# Patient Record
Sex: Female | Born: 1975 | Hispanic: Yes | Marital: Single
Health system: Southern US, Community
[De-identification: ages and names within clinical notes are randomized; demographics above are authoritative.]

---

## 2012-12-26 ENCOUNTER — Emergency Department: Payer: Self-pay | Admitting: Emergency Medicine

## 2012-12-26 LAB — DRUG SCREEN, URINE
Amphetamines, Ur Screen: NEGATIVE (ref ?–1000)
Barbiturates, Ur Screen: NEGATIVE (ref ?–200)
Benzodiazepine, Ur Scrn: NEGATIVE (ref ?–200)
Cannabinoid 50 Ng, Ur ~~LOC~~: NEGATIVE (ref ?–50)
Methadone, Ur Screen: NEGATIVE (ref ?–300)
Phencyclidine (PCP) Ur S: NEGATIVE (ref ?–25)
Tricyclic, Ur Screen: NEGATIVE (ref ?–1000)

## 2012-12-26 LAB — TSH: Thyroid Stimulating Horm: 1.37 u[IU]/mL

## 2012-12-26 LAB — CBC
HCT: 39.7 % (ref 35.0–47.0)
HGB: 13.8 g/dL (ref 12.0–16.0)
MCH: 31.7 pg (ref 26.0–34.0)
MCV: 91 fL (ref 80–100)
WBC: 7.8 10*3/uL (ref 3.6–11.0)

## 2012-12-26 LAB — COMPREHENSIVE METABOLIC PANEL
Alkaline Phosphatase: 76 U/L (ref 50–136)
Anion Gap: 9 (ref 7–16)
Calcium, Total: 9.1 mg/dL (ref 8.5–10.1)
Chloride: 106 mmol/L (ref 98–107)
Creatinine: 0.57 mg/dL — ABNORMAL LOW (ref 0.60–1.30)
Glucose: 132 mg/dL — ABNORMAL HIGH (ref 65–99)
Osmolality: 275 (ref 275–301)
SGOT(AST): 12 U/L — ABNORMAL LOW (ref 15–37)
Total Protein: 8.4 g/dL — ABNORMAL HIGH (ref 6.4–8.2)

## 2012-12-26 LAB — URINALYSIS, COMPLETE
Blood: NEGATIVE
Nitrite: NEGATIVE
Protein: 30
RBC,UR: 5 /HPF (ref 0–5)
Specific Gravity: 1.034 (ref 1.003–1.030)

## 2012-12-26 LAB — ETHANOL: Ethanol: <3 mg/dL

## 2014-03-02 LAB — COMPREHENSIVE METABOLIC PANEL
ALBUMIN: 4.4 g/dL (ref 3.4–5.0)
ALK PHOS: 100 U/L
Anion Gap: 13 (ref 7–16)
BUN: 9 mg/dL (ref 7–18)
Bilirubin,Total: 0.7 mg/dL (ref 0.2–1.0)
CALCIUM: 9.7 mg/dL (ref 8.5–10.1)
CHLORIDE: 110 mmol/L — AB (ref 98–107)
Co2: 18 mmol/L — ABNORMAL LOW (ref 21–32)
Creatinine: 0.57 mg/dL — ABNORMAL LOW (ref 0.60–1.30)
Glucose: 116 mg/dL — ABNORMAL HIGH (ref 65–99)
Osmolality: 281 (ref 275–301)
Potassium: 3.6 mmol/L (ref 3.5–5.1)
SGOT(AST): 65 U/L — ABNORMAL HIGH (ref 15–37)
SGPT (ALT): 135 U/L — ABNORMAL HIGH
Sodium: 141 mmol/L (ref 136–145)
TOTAL PROTEIN: 9.3 g/dL — AB (ref 6.4–8.2)

## 2014-03-02 LAB — ETHANOL
Ethanol %: <0.003 % (ref 0.000–0.080)
Ethanol: <3 mg/dL

## 2014-03-02 LAB — CBC
HCT: 47.5 % — AB (ref 35.0–47.0)
HGB: 15.7 g/dL (ref 12.0–16.0)
MCH: 31.2 pg (ref 26.0–34.0)
MCHC: 33.2 g/dL (ref 32.0–36.0)
MCV: 94 fL (ref 80–100)
PLATELETS: 313 10*3/uL (ref 150–440)
RBC: 5.04 10*6/uL (ref 3.80–5.20)
RDW: 13 % (ref 11.5–14.5)
WBC: 11.4 10*3/uL — ABNORMAL HIGH (ref 3.6–11.0)

## 2014-03-03 ENCOUNTER — Inpatient Hospital Stay: Payer: Self-pay | Admitting: Psychiatry

## 2014-03-03 LAB — URINALYSIS, COMPLETE
Bilirubin,UR: NEGATIVE
Blood: NEGATIVE
Glucose,UR: >=500 mg/dL (ref 0–75)
LEUKOCYTE ESTERASE: NEGATIVE
Nitrite: NEGATIVE
Ph: 6 (ref 4.5–8.0)
Protein: 100
SPECIFIC GRAVITY: 1.031 (ref 1.003–1.030)
Squamous Epithelial: 9

## 2014-03-03 LAB — DRUG SCREEN, URINE

## 2014-03-05 LAB — HEPATIC FUNCTION PANEL A (ARMC)
ALT: 93 U/L — AB
AST: 38 U/L — AB (ref 15–37)
Albumin: 3.9 g/dL (ref 3.4–5.0)
Alkaline Phosphatase: 91 U/L
Bilirubin, Direct: 0.1 mg/dL (ref 0.00–0.20)
Bilirubin,Total: 0.6 mg/dL (ref 0.2–1.0)
Total Protein: 8.4 g/dL — ABNORMAL HIGH (ref 6.4–8.2)

## 2014-03-05 LAB — LIPID PANEL
Cholesterol: 210 mg/dL — ABNORMAL HIGH (ref 0–200)
HDL: 39 mg/dL — AB (ref 40–60)
LDL CHOLESTEROL, CALC: 149 mg/dL — AB (ref 0–100)
TRIGLYCERIDES: 110 mg/dL (ref 0–200)
VLDL CHOLESTEROL, CALC: 22 mg/dL (ref 5–40)

## 2014-03-05 LAB — TSH: Thyroid Stimulating Horm: 1.91 u[IU]/mL

## 2014-03-05 LAB — HEMOGLOBIN A1C: Hemoglobin A1C: 6.7 % — ABNORMAL HIGH (ref 4.2–6.3)

## 2014-03-07 LAB — BASIC METABOLIC PANEL
ANION GAP: 9 (ref 7–16)
BUN: 6 mg/dL — ABNORMAL LOW (ref 7–18)
CALCIUM: 9.4 mg/dL (ref 8.5–10.1)
CO2: 25 mmol/L (ref 21–32)
Chloride: 104 mmol/L (ref 98–107)
Creatinine: 0.51 mg/dL — ABNORMAL LOW (ref 0.60–1.30)
EGFR (African American): >60
Glucose: 121 mg/dL — ABNORMAL HIGH (ref 65–99)
OSMOLALITY: 275 (ref 275–301)
Potassium: 3.7 mmol/L (ref 3.5–5.1)
SODIUM: 138 mmol/L (ref 136–145)

## 2014-03-19 ENCOUNTER — Emergency Department: Payer: Self-pay | Admitting: Emergency Medicine

## 2014-03-27 ENCOUNTER — Emergency Department: Payer: Self-pay | Admitting: Student

## 2014-04-16 ENCOUNTER — Ambulatory Visit: Payer: Self-pay | Admitting: Internal Medicine

## 2014-04-28 LAB — CBC WITH DIFFERENTIAL/PLATELET
BASOS ABS: 0.1 10*3/uL (ref 0.0–0.1)
Basophil %: 0.2 %
EOS PCT: 0 %
Eosinophil #: 0 10*3/uL (ref 0.0–0.7)
HCT: 48.3 % — ABNORMAL HIGH (ref 35.0–47.0)
HGB: 15.5 g/dL (ref 12.0–16.0)
LYMPHS PCT: 3.2 %
Lymphocyte #: 1 10*3/uL (ref 1.0–3.6)
MCH: 30.1 pg (ref 26.0–34.0)
MCHC: 32 g/dL (ref 32.0–36.0)
MCV: 94 fL (ref 80–100)
MONO ABS: 1.3 x10 3/mm — AB (ref 0.2–0.9)
MONOS PCT: 4 %
NEUTROS ABS: 29.9 10*3/uL — AB (ref 1.4–6.5)
NEUTROS PCT: 92.6 %
PLATELETS: 442 10*3/uL — AB (ref 150–440)
RBC: 5.14 10*6/uL (ref 3.80–5.20)
RDW: 13.9 % (ref 11.5–14.5)
WBC: 32.3 10*3/uL — ABNORMAL HIGH (ref 3.6–11.0)

## 2014-04-28 LAB — COMPREHENSIVE METABOLIC PANEL
ALBUMIN: 4.4 g/dL (ref 3.4–5.0)
ANION GAP: 21 — AB (ref 7–16)
Alkaline Phosphatase: 143 U/L — ABNORMAL HIGH
BILIRUBIN TOTAL: 0.7 mg/dL (ref 0.2–1.0)
BUN: 8 mg/dL (ref 7–18)
CO2: 16 mmol/L — AB (ref 21–32)
Calcium, Total: 9.1 mg/dL (ref 8.5–10.1)
Chloride: 104 mmol/L (ref 98–107)
Creatinine: 0.74 mg/dL (ref 0.60–1.30)
EGFR (African American): >60
EGFR (Non-African Amer.): >60
GLUCOSE: 257 mg/dL — AB (ref 65–99)
OSMOLALITY: 288 (ref 275–301)
POTASSIUM: 3 mmol/L — AB (ref 3.5–5.1)
SGOT(AST): 126 U/L — ABNORMAL HIGH (ref 15–37)
SGPT (ALT): 153 U/L — ABNORMAL HIGH
Sodium: 141 mmol/L (ref 136–145)
Total Protein: 9.4 g/dL — ABNORMAL HIGH (ref 6.4–8.2)

## 2014-04-28 LAB — ETHANOL

## 2014-04-28 LAB — WET PREP, GENITAL

## 2014-04-28 LAB — HCG, QUANTITATIVE, PREGNANCY: Beta Hcg, Quant.: <1 m[IU]/mL — ABNORMAL LOW

## 2014-04-28 LAB — ACETAMINOPHEN LEVEL: Acetaminophen: <2 ug/mL

## 2014-04-28 LAB — TROPONIN I: Troponin-I: <0.02 ng/mL

## 2014-04-28 LAB — SALICYLATE LEVEL

## 2014-04-29 ENCOUNTER — Inpatient Hospital Stay: Payer: Self-pay | Admitting: Internal Medicine

## 2014-04-29 ENCOUNTER — Ambulatory Visit: Payer: Self-pay | Admitting: Neurology

## 2014-04-29 LAB — URINALYSIS, COMPLETE
Bilirubin,UR: NEGATIVE
Blood: NEGATIVE
LEUKOCYTE ESTERASE: NEGATIVE
Nitrite: NEGATIVE
PH: 5 (ref 4.5–8.0)
Protein: 30
SQUAMOUS EPITHELIAL: NONE SEEN
Specific Gravity: 1.035 (ref 1.003–1.030)
WBC UR: 2 /HPF (ref 0–5)

## 2014-04-29 LAB — PROTIME-INR
INR: 1.2
PROTHROMBIN TIME: 15 s — AB (ref 11.5–14.7)

## 2014-04-29 LAB — BASIC METABOLIC PANEL
ANION GAP: 10 (ref 7–16)
BUN: 6 mg/dL — AB (ref 7–18)
CREATININE: 0.74 mg/dL (ref 0.60–1.30)
Calcium, Total: 8.1 mg/dL — ABNORMAL LOW (ref 8.5–10.1)
Chloride: 114 mmol/L — ABNORMAL HIGH (ref 98–107)
Co2: 20 mmol/L — ABNORMAL LOW (ref 21–32)
EGFR (African American): >60
Glucose: 257 mg/dL — ABNORMAL HIGH (ref 65–99)
Osmolality: 293 (ref 275–301)
Potassium: 3 mmol/L — ABNORMAL LOW (ref 3.5–5.1)
Sodium: 144 mmol/L (ref 136–145)

## 2014-04-29 LAB — DRUG SCREEN, URINE
Amphetamines, Ur Screen: NEGATIVE (ref ?–1000)
Barbiturates, Ur Screen: NEGATIVE (ref ?–200)
Benzodiazepine, Ur Scrn: NEGATIVE (ref ?–200)
Cannabinoid 50 Ng, Ur ~~LOC~~: NEGATIVE (ref ?–50)
Cocaine Metabolite,Ur ~~LOC~~: NEGATIVE (ref ?–300)
MDMA (ECSTASY) UR SCREEN: NEGATIVE (ref ?–500)
Methadone, Ur Screen: NEGATIVE (ref ?–300)
Opiate, Ur Screen: NEGATIVE (ref ?–300)
Phencyclidine (PCP) Ur S: NEGATIVE (ref ?–25)
TRICYCLIC, UR SCREEN: NEGATIVE (ref ?–1000)

## 2014-04-29 LAB — PHOSPHORUS: PHOSPHORUS: 1.6 mg/dL — AB (ref 2.5–4.9)

## 2014-04-29 LAB — APTT: Activated PTT: 27.8 secs (ref 23.6–35.9)

## 2014-04-29 LAB — TROPONIN I
TROPONIN-I: 0.03 ng/mL
Troponin-I: <0.02 ng/mL

## 2014-04-29 LAB — GC/CHLAMYDIA PROBE AMP

## 2014-04-29 LAB — CK: CK, Total: 71 U/L

## 2014-04-29 LAB — POTASSIUM: Potassium: 3.3 mmol/L — ABNORMAL LOW (ref 3.5–5.1)

## 2014-04-29 LAB — MAGNESIUM: MAGNESIUM: 2.1 mg/dL

## 2014-04-30 LAB — COMPREHENSIVE METABOLIC PANEL
ALK PHOS: 102 U/L
ANION GAP: 9 (ref 7–16)
Albumin: 2.8 g/dL — ABNORMAL LOW (ref 3.4–5.0)
BUN: 7 mg/dL (ref 7–18)
Bilirubin,Total: 0.7 mg/dL (ref 0.2–1.0)
CHLORIDE: 122 mmol/L — AB (ref 98–107)
CO2: 18 mmol/L — AB (ref 21–32)
CREATININE: 0.59 mg/dL — AB (ref 0.60–1.30)
Calcium, Total: 8.3 mg/dL — ABNORMAL LOW (ref 8.5–10.1)
EGFR (African American): >60
EGFR (Non-African Amer.): >60
Glucose: 157 mg/dL — ABNORMAL HIGH (ref 65–99)
OSMOLALITY: 297 (ref 275–301)
Potassium: 4 mmol/L (ref 3.5–5.1)
SGOT(AST): 24 U/L (ref 15–37)
SGPT (ALT): 56 U/L
Sodium: 149 mmol/L — ABNORMAL HIGH (ref 136–145)
Total Protein: 6.9 g/dL (ref 6.4–8.2)

## 2014-04-30 LAB — MAGNESIUM: Magnesium: 2.1 mg/dL

## 2014-04-30 LAB — CBC WITH DIFFERENTIAL/PLATELET
BASOS PCT: 0.5 %
Basophil #: 0.1 10*3/uL (ref 0.0–0.1)
Eosinophil #: 0 10*3/uL (ref 0.0–0.7)
Eosinophil %: 0 %
HCT: 47.1 % — ABNORMAL HIGH (ref 35.0–47.0)
HGB: 14.8 g/dL (ref 12.0–16.0)
LYMPHS ABS: 3.5 10*3/uL (ref 1.0–3.6)
LYMPHS PCT: 15.5 %
MCH: 29.8 pg (ref 26.0–34.0)
MCHC: 31.5 g/dL — ABNORMAL LOW (ref 32.0–36.0)
MCV: 95 fL (ref 80–100)
Monocyte #: 1.4 x10 3/mm — ABNORMAL HIGH (ref 0.2–0.9)
Monocyte %: 6.1 %
NEUTROS ABS: 17.4 10*3/uL — AB (ref 1.4–6.5)
NEUTROS PCT: 77.9 %
PLATELETS: 311 10*3/uL (ref 150–440)
RBC: 4.96 10*6/uL (ref 3.80–5.20)
RDW: 14.2 % (ref 11.5–14.5)
WBC: 22.4 10*3/uL — AB (ref 3.6–11.0)

## 2014-04-30 LAB — TSH: Thyroid Stimulating Horm: 0.253 u[IU]/mL — ABNORMAL LOW

## 2014-04-30 LAB — URINE CULTURE

## 2014-04-30 LAB — HEMOGLOBIN A1C: Hemoglobin A1C: 6.7 % — ABNORMAL HIGH (ref 4.2–6.3)

## 2014-05-01 DIAGNOSIS — G931 Anoxic brain damage, not elsewhere classified: Secondary | ICD-10-CM

## 2014-05-01 DIAGNOSIS — I442 Atrioventricular block, complete: Secondary | ICD-10-CM

## 2014-05-01 LAB — CBC WITH DIFFERENTIAL/PLATELET
BASOS PCT: 0.4 %
Basophil #: 0.1 10*3/uL (ref 0.0–0.1)
Basophil #: 0.1 10*3/uL (ref 0.0–0.1)
Basophil %: 0.5 %
EOS ABS: 0 10*3/uL (ref 0.0–0.7)
EOS PCT: 0.1 %
Eosinophil #: 0 10*3/uL (ref 0.0–0.7)
Eosinophil %: 0.1 %
HCT: 40.3 % (ref 35.0–47.0)
HCT: 38.6 % (ref 35.0–47.0)
HGB: 12.8 g/dL (ref 12.0–16.0)
HGB: 12.3 g/dL (ref 12.0–16.0)
LYMPHS ABS: 2.6 10*3/uL (ref 1.0–3.6)
LYMPHS PCT: 23 %
Lymphocyte #: 3.2 10*3/uL (ref 1.0–3.6)
Lymphocyte %: 22.5 %
MCH: 30 pg (ref 26.0–34.0)
MCH: 30.1 pg (ref 26.0–34.0)
MCHC: 31.8 g/dL — AB (ref 32.0–36.0)
MCHC: 31.9 g/dL — ABNORMAL LOW (ref 32.0–36.0)
MCV: 95 fL (ref 80–100)
MCV: 95 fL (ref 80–100)
Monocyte #: 0.9 x10 3/mm (ref 0.2–0.9)
Monocyte #: 0.8 x10 3/mm (ref 0.2–0.9)
Monocyte %: 6.8 %
Monocyte %: 6.8 %
NEUTROS ABS: 9.7 10*3/uL — AB (ref 1.4–6.5)
Neutrophil #: 8 10*3/uL — ABNORMAL HIGH (ref 1.4–6.5)
Neutrophil %: 69.7 %
Neutrophil %: 70.1 %
Platelet: 256 10*3/uL (ref 150–440)
Platelet: 232 10*3/uL (ref 150–440)
RBC: 4.27 10*6/uL (ref 3.80–5.20)
RBC: 4.09 10*6/uL (ref 3.80–5.20)
RDW: 14.3 % (ref 11.5–14.5)
RDW: 14.3 % (ref 11.5–14.5)
WBC: 14 10*3/uL — AB (ref 3.6–11.0)
WBC: 11.5 10*3/uL — ABNORMAL HIGH (ref 3.6–11.0)

## 2014-05-01 LAB — COMPREHENSIVE METABOLIC PANEL
ALBUMIN: 2.5 g/dL — AB (ref 3.4–5.0)
ALK PHOS: 89 U/L
ALT: 38 U/L
ANION GAP: 9 (ref 7–16)
AST: 44 U/L — AB (ref 15–37)
Albumin: 2.5 g/dL — ABNORMAL LOW (ref 3.4–5.0)
Alkaline Phosphatase: 97 U/L
Anion Gap: 15 (ref 7–16)
BUN: 6 mg/dL — ABNORMAL LOW (ref 7–18)
BUN: 5 mg/dL — ABNORMAL LOW (ref 7–18)
Bilirubin,Total: 0.5 mg/dL (ref 0.2–1.0)
Bilirubin,Total: 0.5 mg/dL (ref 0.2–1.0)
CALCIUM: 8 mg/dL — AB (ref 8.5–10.1)
CALCIUM: 7.8 mg/dL — AB (ref 8.5–10.1)
CHLORIDE: 124 mmol/L — AB (ref 98–107)
CHLORIDE: 118 mmol/L — AB (ref 98–107)
CO2: 19 mmol/L — AB (ref 21–32)
Co2: 20 mmol/L — ABNORMAL LOW (ref 21–32)
Creatinine: 0.46 mg/dL — ABNORMAL LOW (ref 0.60–1.30)
Creatinine: 0.38 mg/dL — ABNORMAL LOW (ref 0.60–1.30)
EGFR (African American): >60
EGFR (Non-African Amer.): >60
EGFR (Non-African Amer.): >60
Glucose: 134 mg/dL — ABNORMAL HIGH (ref 65–99)
Glucose: 136 mg/dL — ABNORMAL HIGH (ref 65–99)
Osmolality: 301 (ref 275–301)
Potassium: 3.9 mmol/L (ref 3.5–5.1)
Potassium: 4.2 mmol/L (ref 3.5–5.1)
SGOT(AST): 14 U/L — ABNORMAL LOW (ref 15–37)
SGPT (ALT): 43 U/L
Sodium: 152 mmol/L — ABNORMAL HIGH (ref 136–145)
Sodium: 153 mmol/L — ABNORMAL HIGH (ref 136–145)
Total Protein: 6.3 g/dL — ABNORMAL LOW (ref 6.4–8.2)
Total Protein: 6.2 g/dL — ABNORMAL LOW (ref 6.4–8.2)

## 2014-05-01 LAB — PHOSPHORUS
PHOSPHORUS: 1.2 mg/dL — AB (ref 2.5–4.9)
Phosphorus: 3.6 mg/dL (ref 2.5–4.9)

## 2014-05-01 LAB — MAGNESIUM
MAGNESIUM: 1.8 mg/dL
Magnesium: 1.7 mg/dL — ABNORMAL LOW

## 2014-05-02 LAB — BASIC METABOLIC PANEL
Anion Gap: 8 (ref 7–16)
BUN: 6 mg/dL — ABNORMAL LOW (ref 7–18)
CALCIUM: 7.1 mg/dL — AB (ref 8.5–10.1)
Chloride: 127 mmol/L — ABNORMAL HIGH (ref 98–107)
Co2: 17 mmol/L — ABNORMAL LOW (ref 21–32)
Creatinine: 0.42 mg/dL — ABNORMAL LOW (ref 0.60–1.30)
Glucose: 105 mg/dL — ABNORMAL HIGH (ref 65–99)
Osmolality: 300 (ref 275–301)
Potassium: 7.5 mmol/L (ref 3.5–5.1)
Sodium: 152 mmol/L — ABNORMAL HIGH (ref 136–145)

## 2014-05-02 LAB — CBC WITH DIFFERENTIAL/PLATELET
Basophil #: 0.1 10*3/uL (ref 0.0–0.1)
Basophil %: 0.8 %
Eosinophil #: 0 10*3/uL (ref 0.0–0.7)
Eosinophil %: 0.6 %
HCT: 35.2 % (ref 35.0–47.0)
HGB: 11 g/dL — ABNORMAL LOW (ref 12.0–16.0)
Lymphocyte #: 1.7 10*3/uL (ref 1.0–3.6)
Lymphocyte %: 22.1 %
MCH: 29.8 pg (ref 26.0–34.0)
MCHC: 31.3 g/dL — ABNORMAL LOW (ref 32.0–36.0)
MCV: 95 fL (ref 80–100)
Monocyte #: 0.5 x10 3/mm (ref 0.2–0.9)
Monocyte %: 6.1 %
Neutrophil #: 5.5 10*3/uL (ref 1.4–6.5)
Neutrophil %: 70.4 %
Platelet: 189 10*3/uL (ref 150–440)
RBC: 3.69 10*6/uL — ABNORMAL LOW (ref 3.80–5.20)
RDW: 14.2 % (ref 11.5–14.5)
WBC: 7.8 10*3/uL (ref 3.6–11.0)

## 2014-05-02 LAB — POTASSIUM
Potassium: 3.3 mmol/L — ABNORMAL LOW (ref 3.5–5.1)
Potassium: 3.1 mmol/L — ABNORMAL LOW (ref 3.5–5.1)

## 2014-05-03 LAB — CBC WITH DIFFERENTIAL/PLATELET
Basophil #: 0 10*3/uL (ref 0.0–0.1)
Basophil %: 0.4 %
EOS PCT: 1.3 %
Eosinophil #: 0.1 10*3/uL (ref 0.0–0.7)
HCT: 35.3 % (ref 35.0–47.0)
HGB: 11.2 g/dL — ABNORMAL LOW (ref 12.0–16.0)
LYMPHS ABS: 1.8 10*3/uL (ref 1.0–3.6)
Lymphocyte %: 22.5 %
MCH: 29.9 pg (ref 26.0–34.0)
MCHC: 31.8 g/dL — ABNORMAL LOW (ref 32.0–36.0)
MCV: 94 fL (ref 80–100)
Monocyte #: 0.4 x10 3/mm (ref 0.2–0.9)
Monocyte %: 5.4 %
Neutrophil #: 5.5 10*3/uL (ref 1.4–6.5)
Neutrophil %: 70.4 %
Platelet: 176 10*3/uL (ref 150–440)
RBC: 3.75 10*6/uL — ABNORMAL LOW (ref 3.80–5.20)
RDW: 14 % (ref 11.5–14.5)
WBC: 7.9 10*3/uL (ref 3.6–11.0)

## 2014-05-03 LAB — BASIC METABOLIC PANEL
Anion Gap: 9 (ref 7–16)
BUN: 7 mg/dL (ref 7–18)
CO2: 28 mmol/L (ref 21–32)
Calcium, Total: 7.8 mg/dL — ABNORMAL LOW (ref 8.5–10.1)
Chloride: 112 mmol/L — ABNORMAL HIGH (ref 98–107)
Creatinine: 0.46 mg/dL — ABNORMAL LOW (ref 0.60–1.30)
EGFR (Non-African Amer.): >60
GLUCOSE: 160 mg/dL — AB (ref 65–99)
Osmolality: 298 (ref 275–301)
POTASSIUM: 2.8 mmol/L — AB (ref 3.5–5.1)
SODIUM: 149 mmol/L — AB (ref 136–145)

## 2014-05-03 LAB — CULTURE, BLOOD (SINGLE)

## 2014-05-03 LAB — SEDIMENTATION RATE: Erythrocyte Sed Rate: 68 mm/hr — ABNORMAL HIGH (ref 0–20)

## 2014-05-03 LAB — URINE CULTURE

## 2014-05-03 LAB — AMMONIA: Ammonia, Plasma: 71 mcmol/L — ABNORMAL HIGH (ref 11–32)

## 2014-05-03 LAB — POTASSIUM: Potassium: 3.1 mmol/L — ABNORMAL LOW (ref 3.5–5.1)

## 2014-05-03 LAB — CK: CK, Total: 3542 U/L — ABNORMAL HIGH

## 2014-05-04 LAB — WOUND AEROBIC CULTURE

## 2014-05-04 LAB — VANCOMYCIN, TROUGH: VANCOMYCIN, TROUGH: 3 ug/mL — AB (ref 10–20)

## 2014-05-04 LAB — POTASSIUM: POTASSIUM: 3.1 mmol/L — AB (ref 3.5–5.1)

## 2014-05-05 LAB — BASIC METABOLIC PANEL
Anion Gap: 8 (ref 7–16)
BUN: 7 mg/dL (ref 7–18)
CALCIUM: 8.1 mg/dL — AB (ref 8.5–10.1)
CO2: 27 mmol/L (ref 21–32)
Chloride: 111 mmol/L — ABNORMAL HIGH (ref 98–107)
Creatinine: 0.41 mg/dL — ABNORMAL LOW (ref 0.60–1.30)
EGFR (African American): >60
Glucose: 213 mg/dL — ABNORMAL HIGH (ref 65–99)
Osmolality: 295 (ref 275–301)
Potassium: 3.2 mmol/L — ABNORMAL LOW (ref 3.5–5.1)
Sodium: 146 mmol/L — ABNORMAL HIGH (ref 136–145)

## 2014-05-05 LAB — CBC WITH DIFFERENTIAL/PLATELET
BASOS ABS: 0 10*3/uL (ref 0.0–0.1)
Basophil %: 0.4 %
Eosinophil #: 0.2 10*3/uL (ref 0.0–0.7)
Eosinophil %: 2.7 %
HCT: 34.5 % — AB (ref 35.0–47.0)
HGB: 11 g/dL — ABNORMAL LOW (ref 12.0–16.0)
Lymphocyte #: 1.5 10*3/uL (ref 1.0–3.6)
Lymphocyte %: 23.1 %
MCH: 30.5 pg (ref 26.0–34.0)
MCHC: 32 g/dL (ref 32.0–36.0)
MCV: 95 fL (ref 80–100)
MONO ABS: 0.4 x10 3/mm (ref 0.2–0.9)
Monocyte %: 6.4 %
Neutrophil #: 4.5 10*3/uL (ref 1.4–6.5)
Neutrophil %: 67.4 %
Platelet: 171 10*3/uL (ref 150–440)
RBC: 3.61 10*6/uL — ABNORMAL LOW (ref 3.80–5.20)
RDW: 13.7 % (ref 11.5–14.5)
WBC: 6.7 10*3/uL (ref 3.6–11.0)

## 2014-05-05 LAB — AMMONIA: AMMONIA, PLASMA: 44 umol/L — AB (ref 11–32)

## 2014-05-05 LAB — CLOSTRIDIUM DIFFICILE(ARMC)

## 2014-05-05 LAB — CK: CK, Total: 1188 U/L — ABNORMAL HIGH

## 2014-05-05 LAB — MAGNESIUM: MAGNESIUM: 1.9 mg/dL

## 2014-05-05 LAB — RAPID HIV SCREEN (HIV 1/2 AB+AG)

## 2014-05-05 LAB — POTASSIUM: Potassium: 3.6 mmol/L (ref 3.5–5.1)

## 2014-05-05 LAB — PHOSPHORUS: Phosphorus: 2.6 mg/dL (ref 2.5–4.9)

## 2014-05-05 LAB — MISC AER/ANAEROBIC CULT.

## 2014-05-06 LAB — CBC WITH DIFFERENTIAL/PLATELET
Basophil #: 0 10*3/uL (ref 0.0–0.1)
Basophil %: 0.4 %
EOS ABS: 0.2 10*3/uL (ref 0.0–0.7)
Eosinophil %: 1.7 %
HCT: 37.3 % (ref 35.0–47.0)
HGB: 12.4 g/dL (ref 12.0–16.0)
LYMPHS ABS: 1.9 10*3/uL (ref 1.0–3.6)
LYMPHS PCT: 20.5 %
MCH: 31.3 pg (ref 26.0–34.0)
MCHC: 33.2 g/dL (ref 32.0–36.0)
MCV: 94 fL (ref 80–100)
MONO ABS: 0.7 x10 3/mm (ref 0.2–0.9)
MONOS PCT: 8.2 %
NEUTROS ABS: 6.3 10*3/uL (ref 1.4–6.5)
Neutrophil %: 69.2 %
Platelet: 231 10*3/uL (ref 150–440)
RBC: 3.96 10*6/uL (ref 3.80–5.20)
RDW: 14.1 % (ref 11.5–14.5)
WBC: 9.1 10*3/uL (ref 3.6–11.0)

## 2014-05-06 LAB — POTASSIUM: Potassium: 3.8 mmol/L (ref 3.5–5.1)

## 2014-05-06 LAB — BASIC METABOLIC PANEL
ANION GAP: 7 (ref 7–16)
BUN: 13 mg/dL (ref 7–18)
CALCIUM: 8.6 mg/dL (ref 8.5–10.1)
CHLORIDE: 108 mmol/L — AB (ref 98–107)
Co2: 30 mmol/L (ref 21–32)
Creatinine: 0.51 mg/dL — ABNORMAL LOW (ref 0.60–1.30)
EGFR (African American): >60
EGFR (Non-African Amer.): >60
Glucose: 150 mg/dL — ABNORMAL HIGH (ref 65–99)
OSMOLALITY: 292 (ref 275–301)
Potassium: 3.4 mmol/L — ABNORMAL LOW (ref 3.5–5.1)
Sodium: 145 mmol/L (ref 136–145)

## 2014-05-06 LAB — AMMONIA: Ammonia, Plasma: 23 mcmol/L (ref 11–32)

## 2014-05-06 LAB — MAGNESIUM: Magnesium: 2.2 mg/dL

## 2014-05-07 LAB — CULTURE, BLOOD (SINGLE)

## 2014-05-07 LAB — CBC WITH DIFFERENTIAL/PLATELET
Basophil #: 0 10*3/uL (ref 0.0–0.1)
Basophil %: 0.5 %
Eosinophil #: 0.1 10*3/uL (ref 0.0–0.7)
Eosinophil %: 1.5 %
HCT: 37.6 % (ref 35.0–47.0)
HGB: 12 g/dL (ref 12.0–16.0)
LYMPHS ABS: 2.4 10*3/uL (ref 1.0–3.6)
LYMPHS PCT: 25.3 %
MCH: 30.7 pg (ref 26.0–34.0)
MCHC: 32 g/dL (ref 32.0–36.0)
MCV: 96 fL (ref 80–100)
Monocyte #: 0.8 x10 3/mm (ref 0.2–0.9)
Monocyte %: 8.9 %
Neutrophil #: 6.1 10*3/uL (ref 1.4–6.5)
Neutrophil %: 63.8 %
Platelet: 261 10*3/uL (ref 150–440)
RBC: 3.92 10*6/uL (ref 3.80–5.20)
RDW: 14.5 % (ref 11.5–14.5)
WBC: 9.5 10*3/uL (ref 3.6–11.0)

## 2014-05-07 LAB — AMMONIA: Ammonia, Plasma: 15 mcmol/L (ref 11–32)

## 2014-05-07 LAB — BASIC METABOLIC PANEL
ANION GAP: 10 (ref 7–16)
BUN: 15 mg/dL (ref 7–18)
Calcium, Total: 8.3 mg/dL — ABNORMAL LOW (ref 8.5–10.1)
Chloride: 107 mmol/L (ref 98–107)
Co2: 28 mmol/L (ref 21–32)
Creatinine: 0.55 mg/dL — ABNORMAL LOW (ref 0.60–1.30)
EGFR (African American): >60
EGFR (Non-African Amer.): >60
Glucose: 203 mg/dL — ABNORMAL HIGH (ref 65–99)
Osmolality: 295 (ref 275–301)
Potassium: 3.1 mmol/L — ABNORMAL LOW (ref 3.5–5.1)
Sodium: 145 mmol/L (ref 136–145)

## 2014-05-07 LAB — POTASSIUM: POTASSIUM: 4 mmol/L (ref 3.5–5.1)

## 2014-05-08 LAB — CBC WITH DIFFERENTIAL/PLATELET
Basophil #: 0.1 10*3/uL (ref 0.0–0.1)
Basophil %: 0.8 %
Eosinophil #: 0.2 10*3/uL (ref 0.0–0.7)
Eosinophil %: 2.2 %
HCT: 35.7 % (ref 35.0–47.0)
HGB: 11.5 g/dL — AB (ref 12.0–16.0)
LYMPHS ABS: 2.4 10*3/uL (ref 1.0–3.6)
LYMPHS PCT: 30.1 %
MCH: 30.5 pg (ref 26.0–34.0)
MCHC: 32.2 g/dL (ref 32.0–36.0)
MCV: 95 fL (ref 80–100)
MONO ABS: 0.8 x10 3/mm (ref 0.2–0.9)
Monocyte %: 10.1 %
NEUTROS ABS: 4.5 10*3/uL (ref 1.4–6.5)
Neutrophil %: 56.8 %
PLATELETS: 264 10*3/uL (ref 150–440)
RBC: 3.76 10*6/uL — AB (ref 3.80–5.20)
RDW: 14.3 % (ref 11.5–14.5)
WBC: 7.8 10*3/uL (ref 3.6–11.0)

## 2014-05-08 LAB — BASIC METABOLIC PANEL
ANION GAP: 8 (ref 7–16)
BUN: 14 mg/dL (ref 7–18)
CHLORIDE: 98 mmol/L (ref 98–107)
CO2: 29 mmol/L (ref 21–32)
CREATININE: 0.45 mg/dL — AB (ref 0.60–1.30)
Calcium, Total: 8.1 mg/dL — ABNORMAL LOW (ref 8.5–10.1)
EGFR (Non-African Amer.): >60
Glucose: 253 mg/dL — ABNORMAL HIGH (ref 65–99)
Osmolality: 279 (ref 275–301)
Potassium: 3 mmol/L — ABNORMAL LOW (ref 3.5–5.1)
Sodium: 135 mmol/L — ABNORMAL LOW (ref 136–145)

## 2014-05-08 LAB — POTASSIUM: Potassium: 2.8 mmol/L — ABNORMAL LOW (ref 3.5–5.1)

## 2014-05-08 LAB — URINE CULTURE

## 2014-05-08 LAB — MAGNESIUM: MAGNESIUM: 2 mg/dL

## 2014-05-08 LAB — VANCOMYCIN, TROUGH: VANCOMYCIN, TROUGH: 7 ug/mL — AB (ref 10–20)

## 2014-05-09 LAB — BASIC METABOLIC PANEL
ANION GAP: 11 (ref 7–16)
BUN: 10 mg/dL (ref 7–18)
Calcium, Total: 7.8 mg/dL — ABNORMAL LOW (ref 8.5–10.1)
Chloride: 98 mmol/L (ref 98–107)
Co2: 31 mmol/L (ref 21–32)
Creatinine: 0.45 mg/dL — ABNORMAL LOW (ref 0.60–1.30)
EGFR (African American): >60
Glucose: 183 mg/dL — ABNORMAL HIGH (ref 65–99)
OSMOLALITY: 283 (ref 275–301)
POTASSIUM: 2.9 mmol/L — AB (ref 3.5–5.1)
Sodium: 140 mmol/L (ref 136–145)

## 2014-05-09 LAB — VANCOMYCIN, TROUGH: Vancomycin, Trough: 9 ug/mL — ABNORMAL LOW (ref 10–20)

## 2014-05-09 LAB — CBC WITH DIFFERENTIAL/PLATELET
Basophil #: 0 10*3/uL (ref 0.0–0.1)
Basophil %: 0.2 %
Eosinophil #: 0.2 10*3/uL (ref 0.0–0.7)
Eosinophil %: 2.4 %
HCT: 35.5 % (ref 35.0–47.0)
HGB: 11.8 g/dL — AB (ref 12.0–16.0)
Lymphocyte #: 2.1 10*3/uL (ref 1.0–3.6)
Lymphocyte %: 23.1 %
MCH: 31.2 pg (ref 26.0–34.0)
MCHC: 33.2 g/dL (ref 32.0–36.0)
MCV: 94 fL (ref 80–100)
MONO ABS: 0.7 x10 3/mm (ref 0.2–0.9)
Monocyte %: 7.3 %
NEUTROS ABS: 6.2 10*3/uL (ref 1.4–6.5)
Neutrophil %: 67 %
Platelet: 276 10*3/uL (ref 150–440)
RBC: 3.79 10*6/uL — AB (ref 3.80–5.20)
RDW: 14.2 % (ref 11.5–14.5)
WBC: 9.3 10*3/uL (ref 3.6–11.0)

## 2014-05-09 LAB — STOOL CULTURE

## 2014-05-09 LAB — POTASSIUM: Potassium: 3.7 mmol/L (ref 3.5–5.1)

## 2014-05-10 LAB — VANCOMYCIN, TROUGH: VANCOMYCIN, TROUGH: 17 ug/mL (ref 10–20)

## 2014-05-11 LAB — CBC WITH DIFFERENTIAL/PLATELET
BASOS ABS: 0 10*3/uL (ref 0.0–0.1)
Basophil %: 0.4 %
EOS ABS: 0.2 10*3/uL (ref 0.0–0.7)
Eosinophil %: 2.1 %
HCT: 33.8 % — AB (ref 35.0–47.0)
HGB: 11.3 g/dL — AB (ref 12.0–16.0)
Lymphocyte #: 2 10*3/uL (ref 1.0–3.6)
Lymphocyte %: 20.3 %
MCH: 32 pg (ref 26.0–34.0)
MCHC: 33.4 g/dL (ref 32.0–36.0)
MCV: 96 fL (ref 80–100)
MONOS PCT: 8.9 %
Monocyte #: 0.9 x10 3/mm (ref 0.2–0.9)
Neutrophil #: 6.8 10*3/uL — ABNORMAL HIGH (ref 1.4–6.5)
Neutrophil %: 68.3 %
Platelet: 294 10*3/uL (ref 150–440)
RBC: 3.53 10*6/uL — ABNORMAL LOW (ref 3.80–5.20)
RDW: 14.7 % — ABNORMAL HIGH (ref 11.5–14.5)
WBC: 9.9 10*3/uL (ref 3.6–11.0)

## 2014-05-11 LAB — COMPREHENSIVE METABOLIC PANEL
ANION GAP: 8 (ref 7–16)
AST: 29 U/L (ref 15–37)
Albumin: 2.7 g/dL — ABNORMAL LOW (ref 3.4–5.0)
Alkaline Phosphatase: 103 U/L
BUN: 10 mg/dL (ref 7–18)
Bilirubin,Total: 0.4 mg/dL (ref 0.2–1.0)
CALCIUM: 8.4 mg/dL — AB (ref 8.5–10.1)
CO2: 31 mmol/L (ref 21–32)
Chloride: 106 mmol/L (ref 98–107)
Creatinine: 0.66 mg/dL (ref 0.60–1.30)
EGFR (African American): >60
EGFR (Non-African Amer.): >60
Glucose: 151 mg/dL — ABNORMAL HIGH (ref 65–99)
Osmolality: 291 (ref 275–301)
POTASSIUM: 2.7 mmol/L — AB (ref 3.5–5.1)
SGPT (ALT): 33 U/L
Sodium: 145 mmol/L (ref 136–145)
TOTAL PROTEIN: 7 g/dL (ref 6.4–8.2)

## 2014-05-11 LAB — SEDIMENTATION RATE: Erythrocyte Sed Rate: 76 mm/hr — ABNORMAL HIGH (ref 0–20)

## 2014-05-11 LAB — AMMONIA: Ammonia, Plasma: 25 mcmol/L (ref 11–32)

## 2014-05-11 LAB — PHOSPHORUS: Phosphorus: 3.9 mg/dL (ref 2.5–4.9)

## 2014-05-11 LAB — MAGNESIUM: MAGNESIUM: 2.3 mg/dL

## 2014-05-11 LAB — POTASSIUM
Potassium: 3.3 mmol/L — ABNORMAL LOW (ref 3.5–5.1)
Potassium: 3.3 mmol/L — ABNORMAL LOW (ref 3.5–5.1)

## 2014-05-11 LAB — CULTURE, BLOOD (SINGLE)

## 2014-05-12 LAB — CBC WITH DIFFERENTIAL/PLATELET
BASOS PCT: 0.5 %
Basophil #: 0 10*3/uL (ref 0.0–0.1)
EOS ABS: 0.1 10*3/uL (ref 0.0–0.7)
Eosinophil %: 1.5 %
HCT: 35.3 % (ref 35.0–47.0)
HGB: 11.6 g/dL — AB (ref 12.0–16.0)
LYMPHS PCT: 22.5 %
Lymphocyte #: 1.8 10*3/uL (ref 1.0–3.6)
MCH: 31.6 pg (ref 26.0–34.0)
MCHC: 32.8 g/dL (ref 32.0–36.0)
MCV: 96 fL (ref 80–100)
MONO ABS: 0.9 x10 3/mm (ref 0.2–0.9)
Monocyte %: 10.7 %
NEUTROS ABS: 5.2 10*3/uL (ref 1.4–6.5)
Neutrophil %: 64.8 %
Platelet: 340 10*3/uL (ref 150–440)
RBC: 3.66 10*6/uL — ABNORMAL LOW (ref 3.80–5.20)
RDW: 15 % — AB (ref 11.5–14.5)
WBC: 8 10*3/uL (ref 3.6–11.0)

## 2014-05-12 LAB — BASIC METABOLIC PANEL
Anion Gap: 8 (ref 7–16)
BUN: 13 mg/dL (ref 7–18)
CHLORIDE: 113 mmol/L — AB (ref 98–107)
CO2: 29 mmol/L (ref 21–32)
Calcium, Total: 8.6 mg/dL (ref 8.5–10.1)
Creatinine: 0.78 mg/dL (ref 0.60–1.30)
EGFR (Non-African Amer.): >60
GLUCOSE: 141 mg/dL — AB (ref 65–99)
OSMOLALITY: 300 (ref 275–301)
POTASSIUM: 3.8 mmol/L (ref 3.5–5.1)
Sodium: 150 mmol/L — ABNORMAL HIGH (ref 136–145)

## 2014-05-12 LAB — PHOSPHORUS: PHOSPHORUS: 3.7 mg/dL (ref 2.5–4.9)

## 2014-05-12 LAB — CULTURE, BLOOD (SINGLE)

## 2014-05-12 LAB — MAGNESIUM: MAGNESIUM: 2.2 mg/dL

## 2014-05-13 LAB — BASIC METABOLIC PANEL
Anion Gap: 11 (ref 7–16)
BUN: 18 mg/dL (ref 7–18)
Calcium, Total: 9.2 mg/dL (ref 8.5–10.1)
Chloride: 110 mmol/L — ABNORMAL HIGH (ref 98–107)
Co2: 29 mmol/L (ref 21–32)
Creatinine: 1.03 mg/dL (ref 0.60–1.30)
EGFR (African American): >60
EGFR (Non-African Amer.): >60
Glucose: 174 mg/dL — ABNORMAL HIGH (ref 65–99)
Osmolality: 304 (ref 275–301)
Potassium: 3 mmol/L — ABNORMAL LOW (ref 3.5–5.1)
Sodium: 150 mmol/L — ABNORMAL HIGH (ref 136–145)

## 2014-05-13 LAB — MAGNESIUM: MAGNESIUM: 2.4 mg/dL

## 2014-05-13 LAB — PHOSPHORUS: Phosphorus: 3.7 mg/dL (ref 2.5–4.9)

## 2014-05-13 LAB — URINE CULTURE

## 2014-05-14 DIAGNOSIS — I499 Cardiac arrhythmia, unspecified: Secondary | ICD-10-CM

## 2014-05-14 LAB — URINALYSIS, COMPLETE
BILIRUBIN, UR: NEGATIVE
Glucose,UR: NEGATIVE mg/dL (ref 0–75)
Ketone: NEGATIVE
Nitrite: NEGATIVE
Ph: 6 (ref 4.5–8.0)
Protein: 100
RBC,UR: 244 /HPF (ref 0–5)
Specific Gravity: 1.012 (ref 1.003–1.030)
Squamous Epithelial: NONE SEEN

## 2014-05-14 LAB — BASIC METABOLIC PANEL
Anion Gap: 9 (ref 7–16)
BUN: 26 mg/dL — AB (ref 7–18)
CREATININE: 1.65 mg/dL — AB (ref 0.60–1.30)
Calcium, Total: 8.7 mg/dL (ref 8.5–10.1)
Chloride: 104 mmol/L (ref 98–107)
Co2: 28 mmol/L (ref 21–32)
EGFR (African American): 45 — ABNORMAL LOW
EGFR (Non-African Amer.): 37 — ABNORMAL LOW
GLUCOSE: 266 mg/dL — AB (ref 65–99)
Osmolality: 295 (ref 275–301)
Potassium: 3.1 mmol/L — ABNORMAL LOW (ref 3.5–5.1)
Sodium: 141 mmol/L (ref 136–145)

## 2014-05-14 LAB — VANCOMYCIN, RANDOM: VANCOMYCIN, RANDOM: 40 ug/mL

## 2014-05-15 LAB — BASIC METABOLIC PANEL
ANION GAP: 11 (ref 7–16)
BUN: 33 mg/dL — ABNORMAL HIGH (ref 7–18)
CREATININE: 1.71 mg/dL — AB (ref 0.60–1.30)
Calcium, Total: 9.1 mg/dL (ref 8.5–10.1)
Chloride: 114 mmol/L — ABNORMAL HIGH (ref 98–107)
Co2: 26 mmol/L (ref 21–32)
EGFR (African American): 43 — ABNORMAL LOW
EGFR (Non-African Amer.): 35 — ABNORMAL LOW
Glucose: 143 mg/dL — ABNORMAL HIGH (ref 65–99)
OSMOLALITY: 310 (ref 275–301)
Potassium: 3.5 mmol/L (ref 3.5–5.1)
SODIUM: 151 mmol/L — AB (ref 136–145)

## 2014-05-15 LAB — CBC WITH DIFFERENTIAL/PLATELET
Basophil #: 0.1 10*3/uL (ref 0.0–0.1)
Basophil %: 0.5 %
EOS ABS: 0.2 10*3/uL (ref 0.0–0.7)
Eosinophil %: 2.1 %
HCT: 36 % (ref 35.0–47.0)
HGB: 11.7 g/dL — AB (ref 12.0–16.0)
LYMPHS ABS: 2.4 10*3/uL (ref 1.0–3.6)
Lymphocyte %: 22 %
MCH: 31.3 pg (ref 26.0–34.0)
MCHC: 32.6 g/dL (ref 32.0–36.0)
MCV: 96 fL (ref 80–100)
Monocyte #: 0.9 x10 3/mm (ref 0.2–0.9)
Monocyte %: 8 %
NEUTROS ABS: 7.3 10*3/uL — AB (ref 1.4–6.5)
NEUTROS PCT: 67.4 %
Platelet: 345 10*3/uL (ref 150–440)
RBC: 3.75 10*6/uL — ABNORMAL LOW (ref 3.80–5.20)
RDW: 14.8 % — ABNORMAL HIGH (ref 11.5–14.5)
WBC: 10.8 10*3/uL (ref 3.6–11.0)

## 2014-05-15 LAB — PROTIME-INR
INR: 1.2
Prothrombin Time: 15.4 secs — ABNORMAL HIGH (ref 11.5–14.7)

## 2014-05-15 LAB — VANCOMYCIN, RANDOM: Vancomycin, Random: 28 ug/mL

## 2014-05-16 LAB — MAGNESIUM: MAGNESIUM: 2.4 mg/dL

## 2014-05-16 LAB — CREATININE, SERUM
Creatinine: 1.6 mg/dL — ABNORMAL HIGH (ref 0.60–1.30)
EGFR (African American): 46 — ABNORMAL LOW
EGFR (Non-African Amer.): 38 — ABNORMAL LOW

## 2014-05-16 LAB — CULTURE, BLOOD (SINGLE)

## 2014-05-16 LAB — POTASSIUM: Potassium: 3.4 mmol/L — ABNORMAL LOW (ref 3.5–5.1)

## 2014-05-16 LAB — PHOSPHORUS: Phosphorus: 3.4 mg/dL (ref 2.5–4.9)

## 2014-05-16 LAB — SODIUM: Sodium: 148 mmol/L — ABNORMAL HIGH (ref 136–145)

## 2014-05-17 ENCOUNTER — Ambulatory Visit: Payer: Self-pay | Admitting: Internal Medicine

## 2014-05-17 LAB — BASIC METABOLIC PANEL
Anion Gap: 8 (ref 7–16)
BUN: 24 mg/dL — AB (ref 7–18)
Calcium, Total: 8.9 mg/dL (ref 8.5–10.1)
Chloride: 112 mmol/L — ABNORMAL HIGH (ref 98–107)
Co2: 25 mmol/L (ref 21–32)
Creatinine: 1.45 mg/dL — ABNORMAL HIGH (ref 0.60–1.30)
GLUCOSE: 182 mg/dL — AB (ref 65–99)
Osmolality: 297 (ref 275–301)
Potassium: 3.2 mmol/L — ABNORMAL LOW (ref 3.5–5.1)
Sodium: 145 mmol/L (ref 136–145)

## 2014-05-17 LAB — MAGNESIUM: Magnesium: 2.2 mg/dL

## 2014-05-17 LAB — PHOSPHORUS: PHOSPHORUS: 3.5 mg/dL (ref 2.5–4.9)

## 2014-05-18 LAB — MAGNESIUM: MAGNESIUM: 2.5 mg/dL — AB

## 2014-05-18 LAB — BASIC METABOLIC PANEL
Anion Gap: 13 (ref 7–16)
BUN: 18 mg/dL (ref 7–18)
Calcium, Total: 9.3 mg/dL (ref 8.5–10.1)
Chloride: 112 mmol/L — ABNORMAL HIGH (ref 98–107)
Co2: 24 mmol/L (ref 21–32)
Creatinine: 1.27 mg/dL (ref 0.60–1.30)
GFR CALC NON AF AMER: 50 — AB
Glucose: 168 mg/dL — ABNORMAL HIGH (ref 65–99)
Osmolality: 302 (ref 275–301)
Potassium: 3.9 mmol/L (ref 3.5–5.1)
Sodium: 149 mmol/L — ABNORMAL HIGH (ref 136–145)

## 2014-05-18 LAB — PHOSPHORUS: PHOSPHORUS: 3.3 mg/dL (ref 2.5–4.9)

## 2014-05-19 LAB — BASIC METABOLIC PANEL
Anion Gap: 10 (ref 7–16)
BUN: 24 mg/dL — ABNORMAL HIGH (ref 7–18)
CALCIUM: 9.1 mg/dL (ref 8.5–10.1)
CO2: 24 mmol/L (ref 21–32)
CREATININE: 1.43 mg/dL — AB (ref 0.60–1.30)
Chloride: 116 mmol/L — ABNORMAL HIGH (ref 98–107)
EGFR (African American): 53 — ABNORMAL LOW
EGFR (Non-African Amer.): 43 — ABNORMAL LOW
Glucose: 179 mg/dL — ABNORMAL HIGH (ref 65–99)
Osmolality: 307 (ref 275–301)
POTASSIUM: 4.1 mmol/L (ref 3.5–5.1)
Sodium: 150 mmol/L — ABNORMAL HIGH (ref 136–145)

## 2014-05-19 LAB — URINALYSIS, COMPLETE
BILIRUBIN, UR: NEGATIVE
GLUCOSE, UR: NEGATIVE mg/dL (ref 0–75)
KETONE: NEGATIVE
Leukocyte Esterase: NEGATIVE
Nitrite: NEGATIVE
Ph: 6 (ref 4.5–8.0)
Protein: NEGATIVE
RBC,UR: 625 /HPF (ref 0–5)
SPECIFIC GRAVITY: 1.008 (ref 1.003–1.030)
Squamous Epithelial: NONE SEEN

## 2014-05-19 LAB — HEMOGLOBIN: HGB: 11.2 g/dL — ABNORMAL LOW (ref 12.0–16.0)

## 2014-05-20 LAB — CBC WITH DIFFERENTIAL/PLATELET
BASOS ABS: 0 10*3/uL (ref 0.0–0.1)
Basophil %: 0.4 %
EOS ABS: 0.3 10*3/uL (ref 0.0–0.7)
Eosinophil %: 3.2 %
HCT: 33.5 % — ABNORMAL LOW (ref 35.0–47.0)
HGB: 11.1 g/dL — ABNORMAL LOW (ref 12.0–16.0)
LYMPHS ABS: 2 10*3/uL (ref 1.0–3.6)
Lymphocyte %: 19.5 %
MCH: 32.1 pg (ref 26.0–34.0)
MCHC: 33.2 g/dL (ref 32.0–36.0)
MCV: 97 fL (ref 80–100)
Monocyte #: 0.8 x10 3/mm (ref 0.2–0.9)
Monocyte %: 7.7 %
Neutrophil #: 7.1 10*3/uL — ABNORMAL HIGH (ref 1.4–6.5)
Neutrophil %: 69.2 %
PLATELETS: 221 10*3/uL (ref 150–440)
RBC: 3.47 10*6/uL — AB (ref 3.80–5.20)
RDW: 14.5 % (ref 11.5–14.5)
WBC: 10.2 10*3/uL (ref 3.6–11.0)

## 2014-05-20 LAB — BASIC METABOLIC PANEL
Anion Gap: 8 (ref 7–16)
BUN: 27 mg/dL — ABNORMAL HIGH (ref 7–18)
CALCIUM: 9.1 mg/dL (ref 8.5–10.1)
CREATININE: 1.12 mg/dL (ref 0.60–1.30)
Chloride: 115 mmol/L — ABNORMAL HIGH (ref 98–107)
Co2: 28 mmol/L (ref 21–32)
EGFR (African American): >60
GFR CALC NON AF AMER: 58 — AB
GLUCOSE: 152 mg/dL — AB (ref 65–99)
Osmolality: 308 (ref 275–301)
Potassium: 4.3 mmol/L (ref 3.5–5.1)
Sodium: 151 mmol/L — ABNORMAL HIGH (ref 136–145)

## 2014-05-21 LAB — HEPATIC FUNCTION PANEL A (ARMC)
ALBUMIN: 3.2 g/dL — AB (ref 3.4–5.0)
ALK PHOS: 127 U/L — AB
Bilirubin,Total: 0.2 mg/dL (ref 0.2–1.0)
SGOT(AST): 26 U/L (ref 15–37)
SGPT (ALT): 34 U/L
Total Protein: 8.2 g/dL (ref 6.4–8.2)

## 2014-05-21 LAB — BASIC METABOLIC PANEL
Anion Gap: 9 (ref 7–16)
BUN: 31 mg/dL — ABNORMAL HIGH (ref 7–18)
CALCIUM: 9 mg/dL (ref 8.5–10.1)
CO2: 27 mmol/L (ref 21–32)
CREATININE: 1.34 mg/dL — AB (ref 0.60–1.30)
Chloride: 113 mmol/L — ABNORMAL HIGH (ref 98–107)
EGFR (African American): 57 — ABNORMAL LOW
GFR CALC NON AF AMER: 47 — AB
GLUCOSE: 171 mg/dL — AB (ref 65–99)
Osmolality: 307 (ref 275–301)
Potassium: 4.2 mmol/L (ref 3.5–5.1)
Sodium: 149 mmol/L — ABNORMAL HIGH (ref 136–145)

## 2014-05-21 LAB — CLOSTRIDIUM DIFFICILE(ARMC)

## 2014-05-21 LAB — URINE CULTURE

## 2014-05-22 LAB — BASIC METABOLIC PANEL
Anion Gap: 8 (ref 7–16)
BUN: 28 mg/dL — AB (ref 7–18)
CALCIUM: 9.1 mg/dL (ref 8.5–10.1)
CO2: 29 mmol/L (ref 21–32)
CREATININE: 1.07 mg/dL (ref 0.60–1.30)
Chloride: 105 mmol/L (ref 98–107)
EGFR (Non-African Amer.): >60
Glucose: 135 mg/dL — ABNORMAL HIGH (ref 65–99)
Osmolality: 291 (ref 275–301)
Potassium: 3.8 mmol/L (ref 3.5–5.1)
Sodium: 142 mmol/L (ref 136–145)

## 2014-05-22 LAB — CBC WITH DIFFERENTIAL/PLATELET
Basophil #: 0 10*3/uL (ref 0.0–0.1)
Basophil %: 0.3 %
EOS ABS: 0.3 10*3/uL (ref 0.0–0.7)
Eosinophil %: 2.7 %
HCT: 32.3 % — ABNORMAL LOW (ref 35.0–47.0)
HGB: 10.8 g/dL — ABNORMAL LOW (ref 12.0–16.0)
Lymphocyte #: 2 10*3/uL (ref 1.0–3.6)
Lymphocyte %: 21.5 %
MCH: 32 pg (ref 26.0–34.0)
MCHC: 33.5 g/dL (ref 32.0–36.0)
MCV: 96 fL (ref 80–100)
MONO ABS: 0.7 x10 3/mm (ref 0.2–0.9)
MONOS PCT: 7.7 %
NEUTROS ABS: 6.5 10*3/uL (ref 1.4–6.5)
Neutrophil %: 67.8 %
Platelet: 185 10*3/uL (ref 150–440)
RBC: 3.38 10*6/uL — AB (ref 3.80–5.20)
RDW: 14.3 % (ref 11.5–14.5)
WBC: 9.5 10*3/uL (ref 3.6–11.0)

## 2014-05-22 LAB — SEDIMENTATION RATE: Erythrocyte Sed Rate: >140 mm/hr — ABNORMAL HIGH (ref 0–20)

## 2014-05-24 LAB — BASIC METABOLIC PANEL
Anion Gap: 7 (ref 7–16)
BUN: 21 mg/dL — AB (ref 7–18)
CALCIUM: 9.1 mg/dL (ref 8.5–10.1)
CHLORIDE: 103 mmol/L (ref 98–107)
Co2: 29 mmol/L (ref 21–32)
Creatinine: 1.03 mg/dL (ref 0.60–1.30)
EGFR (African American): >60
EGFR (Non-African Amer.): >60
GLUCOSE: 145 mg/dL — AB (ref 65–99)
Osmolality: 283 (ref 275–301)
Potassium: 3.6 mmol/L (ref 3.5–5.1)
Sodium: 139 mmol/L (ref 136–145)

## 2014-05-24 LAB — CULTURE, BLOOD (SINGLE)

## 2014-05-26 LAB — CBC WITH DIFFERENTIAL/PLATELET
BASOS ABS: 0 10*3/uL (ref 0.0–0.1)
Basophil %: 0.2 %
EOS ABS: 0.2 10*3/uL (ref 0.0–0.7)
EOS PCT: 2.2 %
HCT: 29.3 % — AB (ref 35.0–47.0)
HGB: 9.8 g/dL — AB (ref 12.0–16.0)
Lymphocyte #: 2.2 10*3/uL (ref 1.0–3.6)
Lymphocyte %: 24.2 %
MCH: 31.7 pg (ref 26.0–34.0)
MCHC: 33.4 g/dL (ref 32.0–36.0)
MCV: 95 fL (ref 80–100)
MONOS PCT: 8 %
Monocyte #: 0.7 x10 3/mm (ref 0.2–0.9)
Neutrophil #: 5.8 10*3/uL (ref 1.4–6.5)
Neutrophil %: 65.4 %
Platelet: 219 10*3/uL (ref 150–440)
RBC: 3.09 10*6/uL — ABNORMAL LOW (ref 3.80–5.20)
RDW: 14 % (ref 11.5–14.5)
WBC: 8.9 10*3/uL (ref 3.6–11.0)

## 2014-05-26 LAB — BASIC METABOLIC PANEL
Anion Gap: 10 (ref 7–16)
BUN: 14 mg/dL (ref 7–18)
CALCIUM: 8.8 mg/dL (ref 8.5–10.1)
CHLORIDE: 106 mmol/L (ref 98–107)
CREATININE: 0.95 mg/dL (ref 0.60–1.30)
Co2: 27 mmol/L (ref 21–32)
Glucose: 141 mg/dL — ABNORMAL HIGH (ref 65–99)
Osmolality: 288 (ref 275–301)
POTASSIUM: 3.4 mmol/L — AB (ref 3.5–5.1)
SODIUM: 143 mmol/L (ref 136–145)

## 2014-05-26 LAB — CULTURE, BLOOD (SINGLE)

## 2014-05-29 LAB — CBC WITH DIFFERENTIAL/PLATELET
BASOS PCT: 0.2 %
Basophil #: 0 10*3/uL (ref 0.0–0.1)
EOS ABS: 0.2 10*3/uL (ref 0.0–0.7)
EOS PCT: 2.2 %
HCT: 31.3 % — ABNORMAL LOW (ref 35.0–47.0)
HGB: 10.6 g/dL — ABNORMAL LOW (ref 12.0–16.0)
LYMPHS PCT: 17.4 %
Lymphocyte #: 1.6 10*3/uL (ref 1.0–3.6)
MCH: 31.7 pg (ref 26.0–34.0)
MCHC: 34 g/dL (ref 32.0–36.0)
MCV: 93 fL (ref 80–100)
Monocyte #: 0.6 x10 3/mm (ref 0.2–0.9)
Monocyte %: 6.8 %
NEUTROS ABS: 6.7 10*3/uL — AB (ref 1.4–6.5)
Neutrophil %: 73.4 %
PLATELETS: 267 10*3/uL (ref 150–440)
RBC: 3.36 10*6/uL — AB (ref 3.80–5.20)
RDW: 13.9 % (ref 11.5–14.5)
WBC: 9 10*3/uL (ref 3.6–11.0)

## 2014-05-29 LAB — HEPATIC FUNCTION PANEL A (ARMC)
ALBUMIN: 3.2 g/dL — AB (ref 3.4–5.0)
Alkaline Phosphatase: 117 U/L — ABNORMAL HIGH
BILIRUBIN TOTAL: 0.3 mg/dL (ref 0.2–1.0)
Bilirubin, Direct: <0.1 mg/dL (ref 0.0–0.2)
SGOT(AST): 19 U/L (ref 15–37)
SGPT (ALT): 28 U/L
TOTAL PROTEIN: 7.9 g/dL (ref 6.4–8.2)

## 2014-05-29 LAB — BASIC METABOLIC PANEL
ANION GAP: 10 (ref 7–16)
BUN: 6 mg/dL — ABNORMAL LOW (ref 7–18)
CO2: 25 mmol/L (ref 21–32)
CREATININE: 0.84 mg/dL (ref 0.60–1.30)
Calcium, Total: 9.1 mg/dL (ref 8.5–10.1)
Chloride: 102 mmol/L (ref 98–107)
EGFR (Non-African Amer.): >60
Glucose: 139 mg/dL — ABNORMAL HIGH (ref 65–99)
Osmolality: 274 (ref 275–301)
Potassium: 3 mmol/L — ABNORMAL LOW (ref 3.5–5.1)
Sodium: 137 mmol/L (ref 136–145)

## 2014-06-01 LAB — BASIC METABOLIC PANEL
ANION GAP: 10 (ref 7–16)
BUN: 8 mg/dL (ref 7–18)
CHLORIDE: 104 mmol/L (ref 98–107)
CO2: 26 mmol/L (ref 21–32)
Calcium, Total: 9 mg/dL (ref 8.5–10.1)
Creatinine: 0.69 mg/dL (ref 0.60–1.30)
Glucose: 144 mg/dL — ABNORMAL HIGH (ref 65–99)
OSMOLALITY: 280 (ref 275–301)
POTASSIUM: 3.2 mmol/L — AB (ref 3.5–5.1)
Sodium: 140 mmol/L (ref 136–145)

## 2014-06-01 LAB — CBC WITH DIFFERENTIAL/PLATELET
BASOS ABS: 0 10*3/uL (ref 0.0–0.1)
BASOS PCT: 0.3 %
EOS PCT: 2.7 %
Eosinophil #: 0.2 10*3/uL (ref 0.0–0.7)
HCT: 32 % — ABNORMAL LOW (ref 35.0–47.0)
HGB: 10.8 g/dL — ABNORMAL LOW (ref 12.0–16.0)
Lymphocyte #: 1.9 10*3/uL (ref 1.0–3.6)
Lymphocyte %: 22 %
MCH: 31.5 pg (ref 26.0–34.0)
MCHC: 33.6 g/dL (ref 32.0–36.0)
MCV: 94 fL (ref 80–100)
Monocyte #: 0.5 x10 3/mm (ref 0.2–0.9)
Monocyte %: 6.4 %
NEUTROS ABS: 5.8 10*3/uL (ref 1.4–6.5)
NEUTROS PCT: 68.6 %
PLATELETS: 290 10*3/uL (ref 150–440)
RBC: 3.42 10*6/uL — ABNORMAL LOW (ref 3.80–5.20)
RDW: 14.4 % (ref 11.5–14.5)
WBC: 8.4 10*3/uL (ref 3.6–11.0)

## 2014-06-03 LAB — POTASSIUM: Potassium: 3.6 mmol/L (ref 3.5–5.1)

## 2014-06-04 LAB — POTASSIUM: POTASSIUM: 3.8 mmol/L (ref 3.5–5.1)

## 2014-06-05 ENCOUNTER — Inpatient Hospital Stay: Payer: Self-pay | Admitting: Psychiatry

## 2014-06-05 LAB — COMPREHENSIVE METABOLIC PANEL
ALBUMIN: 2.8 g/dL — AB (ref 3.4–5.0)
ALT: 23 U/L
AST: 18 U/L (ref 15–37)
Alkaline Phosphatase: 104 U/L
Anion Gap: 6 — ABNORMAL LOW (ref 7–16)
BUN: 8 mg/dL (ref 7–18)
Bilirubin,Total: 0.2 mg/dL (ref 0.2–1.0)
CALCIUM: 8.3 mg/dL — AB (ref 8.5–10.1)
CHLORIDE: 102 mmol/L (ref 98–107)
CO2: 28 mmol/L (ref 21–32)
Creatinine: 0.6 mg/dL (ref 0.60–1.30)
EGFR (African American): >60
Glucose: 128 mg/dL — ABNORMAL HIGH (ref 65–99)
OSMOLALITY: 272 (ref 275–301)
Potassium: 3.4 mmol/L — ABNORMAL LOW (ref 3.5–5.1)
Sodium: 136 mmol/L (ref 136–145)
Total Protein: 7.2 g/dL (ref 6.4–8.2)

## 2014-06-05 LAB — CBC WITH DIFFERENTIAL/PLATELET
Basophil #: 0 10*3/uL (ref 0.0–0.1)
Basophil %: 0.2 %
Eosinophil #: 0.2 10*3/uL (ref 0.0–0.7)
Eosinophil %: 2.6 %
HCT: 28.3 % — ABNORMAL LOW (ref 35.0–47.0)
HGB: 9.4 g/dL — ABNORMAL LOW (ref 12.0–16.0)
Lymphocyte #: 1.6 10*3/uL (ref 1.0–3.6)
Lymphocyte %: 20.7 %
MCH: 31.3 pg (ref 26.0–34.0)
MCHC: 33.3 g/dL (ref 32.0–36.0)
MCV: 94 fL (ref 80–100)
MONO ABS: 0.6 x10 3/mm (ref 0.2–0.9)
MONOS PCT: 7.6 %
NEUTROS ABS: 5.1 10*3/uL (ref 1.4–6.5)
NEUTROS PCT: 68.9 %
PLATELETS: 223 10*3/uL (ref 150–440)
RBC: 3 10*6/uL — ABNORMAL LOW (ref 3.80–5.20)
RDW: 14 % (ref 11.5–14.5)
WBC: 7.5 10*3/uL (ref 3.6–11.0)

## 2014-06-05 LAB — TSH: Thyroid Stimulating Horm: 2.55 u[IU]/mL

## 2014-06-08 LAB — CBC WITH DIFFERENTIAL/PLATELET
Basophil #: 0 10*3/uL (ref 0.0–0.1)
Basophil %: 0.2 %
Eosinophil #: 0.2 10*3/uL (ref 0.0–0.7)
Eosinophil %: 2.4 %
HCT: 30.3 % — AB (ref 35.0–47.0)
HGB: 9.9 g/dL — ABNORMAL LOW (ref 12.0–16.0)
LYMPHS ABS: 2.3 10*3/uL (ref 1.0–3.6)
Lymphocyte %: 33 %
MCH: 30.7 pg (ref 26.0–34.0)
MCHC: 32.7 g/dL (ref 32.0–36.0)
MCV: 94 fL (ref 80–100)
MONOS PCT: 8 %
Monocyte #: 0.6 x10 3/mm (ref 0.2–0.9)
Neutrophil #: 3.9 10*3/uL (ref 1.4–6.5)
Neutrophil %: 56.4 %
Platelet: 243 10*3/uL (ref 150–440)
RBC: 3.23 10*6/uL — ABNORMAL LOW (ref 3.80–5.20)
RDW: 14.1 % (ref 11.5–14.5)
WBC: 7 10*3/uL (ref 3.6–11.0)

## 2014-06-08 LAB — POTASSIUM: POTASSIUM: 3.3 mmol/L — AB (ref 3.5–5.1)

## 2014-06-26 LAB — CBC WITH DIFFERENTIAL/PLATELET
BASOS ABS: 0 10*3/uL (ref 0.0–0.1)
Basophil %: 0.5 %
Eosinophil #: 0.1 10*3/uL (ref 0.0–0.7)
Eosinophil %: 1.9 %
HCT: 34.8 % — ABNORMAL LOW (ref 35.0–47.0)
HGB: 11.3 g/dL — ABNORMAL LOW (ref 12.0–16.0)
LYMPHS ABS: 2.5 10*3/uL (ref 1.0–3.6)
Lymphocyte %: 35.4 %
MCH: 30 pg (ref 26.0–34.0)
MCHC: 32.6 g/dL (ref 32.0–36.0)
MCV: 92 fL (ref 80–100)
MONOS PCT: 7 %
Monocyte #: 0.5 x10 3/mm (ref 0.2–0.9)
Neutrophil #: 3.9 10*3/uL (ref 1.4–6.5)
Neutrophil %: 55.2 %
Platelet: 309 10*3/uL (ref 150–440)
RBC: 3.78 10*6/uL — ABNORMAL LOW (ref 3.80–5.20)
RDW: 14.7 % — ABNORMAL HIGH (ref 11.5–14.5)
WBC: 7.1 10*3/uL (ref 3.6–11.0)

## 2014-06-26 LAB — BASIC METABOLIC PANEL
ANION GAP: 8 (ref 7–16)
BUN: 8 mg/dL (ref 7–18)
CALCIUM: 9.1 mg/dL (ref 8.5–10.1)
CHLORIDE: 106 mmol/L (ref 98–107)
Co2: 26 mmol/L (ref 21–32)
Creatinine: 0.45 mg/dL — ABNORMAL LOW (ref 0.60–1.30)
EGFR (Non-African Amer.): >60
GLUCOSE: 100 mg/dL — AB (ref 65–99)
Osmolality: 278 (ref 275–301)
POTASSIUM: 3.7 mmol/L (ref 3.5–5.1)
SODIUM: 140 mmol/L (ref 136–145)

## 2014-09-15 ENCOUNTER — Ambulatory Visit
Admit: 2014-09-15 | Disposition: A | Discharge status: Home / Self Care | Payer: Self-pay | Attending: Internal Medicine | Admitting: Internal Medicine

## 2014-11-07 NOTE — Consult Note (Signed)
Brief Consult Note: Diagnosis: Peg tube placement.   Consult note dictated.   Comments: This patient was seen today for PEG tube placement. She is extubated and awake.  She follows no commands.  She did move her eyes to my voice. She has  a soft abdomen, good bowel sounds, and has a vertical  abdominal surgical scar.  She has shown refusal to have the Dobhoff placed.  Her sitter reports that she was moving her right arm a lot yesterday, but not moving her arms as much today. Patient is not able to give informed consent for a G-tube placement.  Her 40 year old daughter who is fluent in [place] would need to give consent. The concerns about placing a G- tube  are legal consent and potential risks for this patient to pull out her G-tube.  This case was discussed with Dr. Bluford Kaufmannh in collaboration of care.  Electronic Signatures: Rowan BlaseMills, Malissia Rabbani Ann (NP)  (Signed 28-Oct-15 13:39)  Authored: Brief Consult Note   Last Updated: 28-Oct-15 13:39 by Rowan BlaseMills, Shekita Boyden Ann (NP)

## 2014-11-07 NOTE — Consult Note (Signed)
Psychiatry: Follow-up for this patient with psychosis and depression.  Patient interviewed with interpreter.  Case discussed with hospitalist and nursing staff.  Chart reviewed.  Patient has no new complaints today.  Reports that she is not feeling depressed.  Denies hearing voices or receiving messages.  Says that she is feeling physically stronger.  Still has some urinary incontinence but not as consistently as yesterday. today seems more blank.  At times she looks nervous and withdrawn.  I am not sure whether to trust her statements that she is no longer having psychotic symptoms.  She does appear to be compliant with her current medicine and tolerating the Seroquel 300 mg at night.  Patient is stating that her goal is to go home and live with her daughter.  Denies any suicidal ideation. would still like the patient to come to the psychiatry ward for further stabilization.  I don't think she is quite back to baseline and her daughter has backed this up.  For now I am not continuing with ECT but relying on antipsychotic medicine.  I understand that it is a burden to have her on contact precautions on the psychiatry ward but I am hoping that we will be able to accommodate it as she really no longer requires the internal medicine bed.  Continue following up in negotiating this tomorrow.  No change to diagnosis.  Schizoaffective disorder.  Electronic Signatures: Ortha Metts, Jackquline DenmarkJohn T (MD)  (Signed on 19-Nov-15 19:40)  Authored  Last Updated: 19-Nov-15 19:40 by Audery Amellapacs, Lilyann Gravelle T (MD)

## 2014-11-07 NOTE — Consult Note (Signed)
PSychiatry: PAtient seen with interpreter. Had bilateral ECT this morning done with ketamine anesthesia with good tolerance. This afternoon continues to appear lethargic and confused. States mood is not depressed. Denies hallucinations but makes comments about things that sound paranoid. Ate but not a lot.  be near max benifit for current ECT use but worth proceding at least into Monday. Will increase antipsychotic medication. Also will try switch to Seroquel as current remeron may be too sedating. Taper risperdal and start seroquel now that she can swallow.   Electronic Signatures: Brysin Towery, Jackquline DenmarkJohn T (MD)  (Signed on 13-Nov-15 23:08)  Authored  Last Updated: 13-Nov-15 23:08 by Audery Amellapacs, Brailen Macneal T (MD)
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
## 2014-11-07 NOTE — Consult Note (Signed)
Discussed case with anesthesia. With 3 recent cardiac arrests, patient at high risk for sedation. Also, need to know how she would respond to ECT even before proceeding with EGD/PEG.. If patient starts to be more alert after ECT, perhaps she will be able to eat on her own. Anyway, we will cancel procedure. I will check back next week. thanks.  Electronic Signatures: Lutricia Feilh, Valynn Schamberger (MD)  (Signed on 30-Oct-15 09:10)  Authored  Last Updated: 30-Oct-15 09:10 by Lutricia Feilh, Kayce Chismar (MD)

## 2014-11-07 NOTE — Discharge Summary (Signed)
PATIENT NAME:  Mary Glenn, Mary Glenn MR#:  [id number] DATE OF BIRTH:  [birth year]  DATE OF ADMISSION:  04/29/2014 DATE OF DISCHARGE:  06/05/2014  The patient's interim discharge summary was dictated by Dr. Nemiah CommanderKalisetti on 15th November. My discharge summary will include hospital course from 16th to 20th November.   HOSPITAL COURSE: The patient improved progressively, as far as her mental status. She was eating well, was ambulating well with physical therapy. She needed some constant cues to get her moving around in the room. Her mood and affect were stable. Her ECT treatments were held since she was no more catatonic, per Dr. Toni Amendlapacs. Dr. Toni Amendlapacs recommends now the patient be transferred down to behavior medicine for further ECT treatments and psychiatry management for her depression. Overall the rest of the medical problems, including diabetes, remained stable. 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 This was discussed with Ronny BaconSarah Wall, infection control.   CODE STATUS: FULL code.   DISCHARGE MEDICATIONS: 1.  Metformin 500 mg p.o.  2.  Metoprolol 50 mg b.i.d.  3.  Acetaminophen 325 mg 2 tablets every 6 hours as needed.  4.  Seroquel 300 mg 1 tablet daily at bedtime.  5.  Modafinil 100 mg daily.  6.  Glucerna shake 1 can b.i.d.  7.  Docusate 100 mg daily.   DISPOSITION: Further treatment per Dr. Toni Amendlapacs.   DISCHARGE DIET: ADA 1800 calorie diet. ____________________________ Jearl KlinefelterSona A. Allena KatzPatel, MD sap:sb D: 06/05/2014 07:39:04 ET T: 06/05/2014 13:30:42 ET JOB#: [id number]  cc: Malesha Suliman A. Allena KatzPatel, MD, <Dictator> Willow OraSONA A Sherrine Salberg MD ELECTRONICALLY SIGNED 06/18/2014 14:06

## 2014-11-07 NOTE — Consult Note (Signed)
Psychiatry: Patient seen and chart reviewed. Spoke with daughter. Since yesterday have also spoken with gyn consultant. today patient has no new complaint. Says she is not feeling depressed. Denies having hallucinations. Denies any suicidal ideation. Patient has a blunted affect. Make ok eye contact. Speach is decreased in amount but clear. Thoughts seem more organized and less bizarre.  better and drinking. Unclear how much she is able to ambulate. and I agree she is high risk at home due to recurrent noncomplience. [facility].  dc amantadine - no clear indication at this point. DC the last risperdal and increase seroquel to 300mg  at night. to hold ECT  Electronic Signatures: Shawna Wearing, Jackquline DenmarkJohn T (MD)  (Signed on 17-Nov-15 17:15)  Authored  Last Updated: 17-Nov-15 17:15 by Audery Amellapacs, Laithan Conchas T (MD)

## 2014-11-07 NOTE — Consult Note (Signed)
Psychiatry: Follow-up for this woman with schizophrenia or psychotic mood disorder status post intentional overdose.  She continues to be intubated and unable to provide any history.  No change that I can see to mental state.  I will continue to follow up and provide treatment especially after she is extubated to see if we need to have her come to psychiatry at some point.  Electronic Signatures: Tung Pustejovsky, Jackquline DenmarkJohn T (MD)  (Signed on 20-Oct-15 19:37)  Authored  Last Updated: 20-Oct-15 19:37 by Audery Amellapacs, Ezio Wieck T (MD)

## 2014-11-07 NOTE — Consult Note (Signed)
Psychiatry: Follow-up consult for this patient with psychosis and depression status post suicide attempt.  Since I last saw her she has been successfully extubated.  Her brother tells me that yesterday and the day before she was verbal and much more interactive.  Today she is no longer verbal even with the family.  Would not interact with me at all.  She appears to be awake but would not follow hand signs or squeeze my fingers.  She is also febrile today appears to be a little sicker. with the brother and nursing staff the continuing question of when to more aggressively initiate psychiatric treatment.  Brother is concerned about the fever and the apparent retro-aggression.  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  Will not change any medications for today but continue to follow daily.  I will try to bring an interpreter to talk with her to see if I can get her to be more verbal with me.   Electronic Signatures: Audery Amellapacs, John T (MD) (Signed on 26-Oct-15 21:32)  Authored   Last Updated: 26-Oct-15 23:47 by Audery Amellapacs, John T (MD)

## 2014-11-07 NOTE — Consult Note (Signed)
Psychiatry: Follow-up for this 39 year old woman with psychosis and depression now in a severely debilitated state on the critical care unit.  Patient continues to be intubated.  Not able to provide any history to me.  Reviewed chart and discussed the case with nursing staff.  This morning apparently the patient was so unresponsive that they could not get her to respond even to deep painful stimuli.  In the afternoon and evening however the patient has responded to verbal attempts by her family.  Daughter and brother both of been able to get her to respond with eye movements and hand movements. is now febrile.  Tachycardic more consistently.  Quite sick.  I am hesitant to provide any risk by adding any other psychiatric medicines that are not necessary while her medical condition is still as it is.  Continue current monitoring no change to treatment no change in diagnosis of psychosis NOS rule out schizophrenia or psychotic depression.  Electronic Signatures: Tabbitha Janvrin, Jackquline DenmarkJohn T (MD)  (Signed on 22-Oct-15 17:00)  Authored  Last Updated: 22-Oct-15 17:00 by Audery Amellapacs, Laiza Veenstra T (MD)

## 2014-11-07 NOTE — Consult Note (Signed)
Psychiatry: Patient continues to refuse food and refuses or is unable to speak. Affect flat. No interaction with staff and minimal with daughter. is for BL ECT to begin tomorrow. Patient will also get a PEG tomorrow afterwards. Family aware. Will get an interpreter for the procedure.  Electronic Signatures: Audery Amellapacs, John T (MD)  (Signed on 29-Oct-15 20:41)  Authored  Last Updated: 29-Oct-15 20:41 by Audery Amellapacs, John T (MD)

## 2014-11-07 NOTE — Consult Note (Signed)
PATIENT NAME:  Mary Glenn, Mary Glenn MR#:  [id number] DATE OF BIRTH:  01-01-1976  DATE OF CONSULTATION:  05/04/2014  REFERRING PHYSICIAN:    CONSULTING PHYSICIAN:  Mary Amel, MD  IDENTIFYING INFORMATION AND REASON FOR CONSULT:  A 39 year old woman who was admitted to the hospital on the 13th after being found unresponsive with a presumed overdose. Information obtained primarily from the daughter and from the chart as the patient is unable to communicate.   HISTORY OF PRESENT ILLNESS: Daughter reports that on the day of admission family found her to be unresponsive passed out on the floor. They also found a suicide note that she had written. It was evident to them that she had overdosed on medication, although they did not know what she had actually taken. The patient has been in the critical care unit and has been intubated since admission and unable to give any further history. Daughter reports that in the time leading up to her hospitalization the patient's mental health had continued to appear to be unstable. She had seemed to be confused and emotionally distraught. Had made frequent comments about thinking she was not going to wake up in the morning. Apparently had not made any specific threats about killing herself, but had seemed to be very morbid and depressed. It was unclear to the family whether she had been compliant with any of her medication, although it does appear unlikely that she got her Haldol decanoate shot in September. Sleep had been poor. Energy level had been poor. The patient had continued to have poor insight and had given evidence of being noncompliant with treatment.   PAST PSYCHIATRIC HISTORY: Daughter reports that the patient has had significant mental health symptoms that had been probably present for over a year. She has had hospitalizations in [place] and in [place], now with a diagnosis of schizophrenia. They describe behavior of erratic rambling, moving from one  site to another with no clear purpose, hallucinations, disorganized thinking, mood instability. There had been concern about her ability to take care of her young son. The patient had been given a diagnosis of schizophrenia when she was hospitalized in [place] and had been prescribed Risperdal and Celexa. Does not seem likely that she had been taking her medicine. In the hospital here she was treated with Haldol decanoate and was discharged without an antidepressant but on antipsychotics. No known prior history of suicide attempts.   SOCIAL HISTORY: The patient had most recently been in [place] residing with her young daughter who seems to be the most stable member of the immediate family. There is a younger son of the patient, brother of the daughter involved as well. The patient's father had passed away in [place] back in the summertime which had precipitated her hospitalization here. The patient is not working. I did not question her legal status at this time.   FAMILY HISTORY: No known family history I can identify of mental illness.   SUBSTANCE ABUSE HISTORY: No known alcohol or drug abuse history.   PAST MEDICAL HISTORY: The patient is still on a ventilator currently. Underlying medical problems relating to that including infections, multiple of them present, but her previous medical history before this hospitalization seems to have been fairly benign outside the mental illness and being overweight. It sounds like she had been identified as having high blood pressure and diabetes using which was controlled with oral medicine.   REVIEW OF SYSTEMS: The patient is unable to give any history and unable  to give any review of systems.   MENTAL STATUS EXAMINATION: Intubated patient. I did not attempt to communicate. Family says she is not able to communicate any words, only to move hands slightly and  responds to request. Affect flat.    ASSESSMENT: This is a 39 year old woman who has been given a  diagnosis of schizophrenia, although I would suggest it still seems like an open question whether this could be a psychotic depression as well or even bipolar disorder. The patient took an overdose evidently with suicidal intent. Remains on a ventilator. At this point, there is really nothing specific we can do as far as diagnosing or treating her mental illness. She is not agitated right now. Does not require any medication for that.   TREATMENT PLAN: Continue to follow patient. May need to intervene once she is extubated,  if she is agitated.  In any case once she is able to communicate we can get a better history and gradually decide if we need to intervene any further and in what way psychiatrically.   DIAGNOSIS, PRINCIPAL AND PRIMARY: AXIS I: Schizophrenia by history.   SECONDARY DIAGNOSES: AXIS I: Rule out major depression with psychotic features.   AXIS II: Deferred.   AXIS III: Intubated after hypoxic injury, diabetes, high blood pressure.     ____________________________ Mary AmelJohn T. [name], MD jtc:jw D: 05/04/2014 17:08:23 ET T: 05/04/2014 17:39:57 ET JOB#: [id number]  cc: Mary AmelJohn T. Mary Reister, MD, <Dictator> Mary AmelJOHN T Mary Footman MD ELECTRONICALLY SIGNED 05/06/2014 16:16

## 2014-11-07 NOTE — Consult Note (Signed)
Psychiatry: Patient seen with interpreter and patient had ECT this morning. Also spoke with family with an interpreter. Patient is not awake and interactive. Did not know where she was but understood the basic situation. Denied any pain. After treatment today she has started taking food by mouth and is talking freely. Affect tired but reactive with some smiling. depression psychotic improving. Tomorrow will reassess best medication if she is able to swallow pills. Continue ECT next treatment Wednesday.Treatments continue to be well tolerated.  Electronic Signatures: Audery Amellapacs, Ethelmae Ringel T (MD)  (Signed on 09-Nov-15 22:31)  Authored  Last Updated: 09-Nov-15 22:31 by Audery Amellapacs, Anastassia Noack T (MD)

## 2014-11-07 NOTE — Consult Note (Signed)
Pt seen and examined. Pt intubated and poorly responsive. Please see K. Arvilla MarketMills' notes. S/P suicide attempt. Septic. 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 Enteritis via CT. Enteritis from infection but also possibly from ischemia. Agree with [facility]. If pt's condition improves over time, consider MRI to check for fistula later. Will follow. Thanks.  Electronic Signatures: Lutricia Feilh, Ragan Duhon (MD)  (Signed on 15-Oct-15 08:20)  Authored  Last Updated: 15-Oct-15 08:20 by Lutricia Feilh, Deshane Cotroneo (MD)

## 2014-11-07 NOTE — Consult Note (Signed)
General Aspect PCP: Nonlocal Primary Cardiologist: New to [facility] ___________________  39 year old female with history of DM2, HLD, depression, and schizophrenia who presented to [facility] ED on 04/29/2014 via EMS for respiratory failure. She was found down in her home for an unknown amount of time and unresponsive, but breathing on her own. Further reports indicate a letter was found written by her possibly indicating a suicide attempt after apparently taking multiple antipsychotic medications. She has had multiple codes called today 2/2 episodes of sinus rhythm with complete AV block.. She has responded well to a short courses of CPR each time returning to NSR. We are consulted for further evaluation. _________________  PMH: 1. DM2 2. HLD 3. Depression 4. Schizophrenia ___________________  Surgical Hx: 1. Uterine surgeries   Present Illness She was initially intubated by EMS 2/2 hypoxia and obtundation.  Her presentation was thought to be 2/2 opiate intoxication given her inital presentation, thus she was given Narcan, but did not have significant improvement. She was started on Levaquin, Flagyl, and vancomycin although no abscess was seen on abdominal CT scan. It is concerning that there is such a large amount of discharge from the vagina. The daughter shares with [place] that the patient's surgical history of some form of uterine infection (perhaps retained products of conception or infection) following childbirth and C-section. She was loaded with Dilantin 2/2 possible seizure-like activity.   She has had multiple episodes of complete AV block leading to ACLS protocol throughout the day. After a short episode of CPR each time NSR will be returned. Pacer pads have been applied since the first episode, and a threshold of 50 was set after the most recent episode this afternoon.   EEG showed diffuse slowing - no acute seizure-like activity. Case was explained to the family that there was almost no chance  for meaningful recovery from a neuro standpoint - they are awaiting family from [place].  CT of the head showed no acute intracranial process. CT of the abdomen shows diffuse enteritis, which could be infectious or inflammatory, cholelithiasis and hepatic steatosis. Notably, the description within the body of the interpretation states that there is diffuse thickening and hyperenhancement of small bowel with mid interloop ascites, which is considered reactive. There is also some scattering of high-density areas within the bowel, which is likely ingested material. There is no pneumobilia and there are no skip inflammation lesions.   Physical Exam:  GEN critically ill appearing, intubated   HEENT pale conjunctivae, intubated   NECK supple   RESP clear BS   CARD Regular rate and rhythm  No murmur   ABD soft  normal BS   LYMPH negative neck   EXTR negative edema   SKIN normal to palpation   NEURO intubated   PSYCH intubated   Review of Systems:  Review of Systems: All other systems were reviewed and found to be negative    ROS Pt not able to provide ROS   Medications/Allergies Reviewed Medications/Allergies reviewed   Family & Social History:  Family and Social History:  Family History Negative  Hypertension  Diabetes Mellitus   Social History negative tobacco, negative ETOH, negative Illicit drugs   Place of Living Home     Hypertension:    Diabetes:    Denies medical history:          Admit Diagnosis:   RESPIRATORY FAILURE WITH HYPOXIA: Onset Date: 01-May-2014, Status: Active, Description: RESPIRATORY FAILURE WITH HYPOXIA  Home Medications: Medication Instructions Status  meloxicam  15 mg oral tablet 1 tab(s) orally once a day x 14 days Active  clonazePAM 1 mg oral tablet 1 tab(s) orally once a day (at bedtime) for insomnia Active  haloperidol 100 milligram(s) intramuscular once a month for schizophrenia due sept 24 Active  benztropine 1 mg oral tablet 1 tab(s)  orally 2 times a day to prevent EPS Active  ranitidine 150 mg oral tablet 1 tab(s) orally once a day for GERD Active  metoprolol succinate 50 mg oral tablet, extended release 1 tab(s) orally once a day for HTN Active  metFORMIN 500 mg oral tablet 1 tab(s) orally 2 times a day (with meals) for Diabetes Active  haloperidol 5 mg oral tablet 1 tab(s) orally 2 times a day for schizophrenia Active   Lab Results:  Hepatic:  16-Oct-15 15:23   Bilirubin, Total 0.5  Alkaline Phosphatase 97 (46-116 NOTE: New Reference Range 02/03/14)  SGPT (ALT) 43 (14-63 NOTE: New Reference Range 02/03/14)  SGOT (AST)  44  Total Protein, Serum  6.2  Albumin, Serum  2.5  Routine Chem:  16-Oct-15 15:23   Glucose, Serum  136  BUN  5  Creatinine (comp)  0.38  Sodium, Serum  153  Potassium, Serum 4.2  Chloride, Serum  118  CO2, Serum  20  Calcium (Total), Serum  7.8  eGFR (African American) >60  eGFR (Non-African American) >60 (eGFR values <62m/min/1.73 m2 may be an indication of chronic kidney disease (CKD). Calculated eGFR, using the MRDR Study equation, is useful in  patients with stable renal function. The eGFR calculation will not be reliable in acutely ill patients when serum creatinine is changing rapidly. It is not useful in patients on dialysis. The eGFR calculation may not be applicable to patients at the low and high extremes of body sizes, pregnant women, and vetetarians.)  Result Comment labs - This specimen was collected through an   - indwelling catheter or arterial line.  - A minimum of 573m of blood was wasted prior    - to collecting the sample.  Interpret  - results with caution.  Result(s) reported on 01 May 2014 at 04:00PM.  Anion Gap 15  Magnesium, Serum  1.7 (1.8-2.4 THERAPEUTIC RANGE: 4-7 mg/dL TOXIC: > 10 mg/dL  -----------------------)  Phosphorus, Serum 3.6 (Result(s) reported on 01 May 2014 at 03:56PM.)  Routine Hem:  16-Oct-15 15:23   WBC (CBC)  11.5  RBC (CBC)  4.09  Hemoglobin (CBC) 12.3  Hematocrit (CBC) 38.6  Platelet Count (CBC) 232  MCV 95  MCH 30.1  MCHC  31.9  RDW 14.3  Neutrophil % 70.1  Lymphocyte % 22.5  Monocyte % 6.8  Eosinophil % 0.1  Basophil % 0.5  Neutrophil #  8.0  Lymphocyte # 2.6  Monocyte # 0.8  Eosinophil # 0.0  Basophil # 0.1 (Result(s) reported on 01 May 2014 at 03:44PM.)   EKG:  EKG Interp. by me   Interpretation EKG shows sinus tach, 120, nonspecific st/t changes   Radiology Results:  XRay:    16-Oct-15 06:32, Chest Portable Single View  Chest Portable Single View   REASON FOR EXAM:    resp failure, on vent  COMMENTS:       PROCEDURE: DXR - DXR PORTABLE CHEST SINGLE VIEW  - May 01 2014  6:32AM     CLINICAL DATA:  Respiratory failure.    EXAM:  PORTABLE CHEST - 1 VIEW    COMPARISON:  04/30/2014 and 04/29/2014    FINDINGS:  Endotracheal tube, central line,  and NG tube all appear in good  position. Heart size and pulmonary vascularity are normal. Right  lung is clear. There is persistent atelectasis/effusion at the left  lung base.     IMPRESSION:  Persistent atelectasis/ effusion at the left lung base. Right lung  is now clear.      Electronically Signed    By: Rozetta Nunnery M.D.    On: 05/01/2014 08:10         Verified By: Larey Seat, M.D.,  CT:    13-Oct-15 22:50, CT Abdomen and Pelvis With Contrast  CT Abdomen and Pelvis With Contrast   REASON FOR EXAM:    (1) malodorus discharge; (2) malodorous discharge,   copious  COMMENTS:       PROCEDURE: CT  - CT ABDOMEN / PELVIS  W  - Apr 28 2014 10:50PM     CLINICAL DATA:  Copious malodorus discharge.  Initial encounter.    EXAM:  CT ABDOMENAND PELVIS WITH CONTRAST    TECHNIQUE:  Multidetector CT imaging of the abdomen and pelvis was performed  using the standard protocol following bolus administration of  intravenous contrast.  CONTRAST:  100 cc Isovue 370 intravenous    COMPARISON:  None.    FINDINGS:  BODY WALL:  Unremarkable.    LOWER CHEST: Unremarkable.    ABDOMEN/PELVIS:    Liver: Diffuse fatty infiltration.    Biliary: Cholelithiasis. No evidence of gallbladder inflammation or  obstruction.  Pancreas: Unremarkable.    Spleen:Unremarkable.    Adrenals: Unremarkable.    Kidneys and ureters: No hydronephrosis or stone. 18 mm cyst in the  interpolar left kidney.    Bladder: Unremarkable.    Reproductive: Unremarkable.    Bowel: There is diffuse thickening and hyper enhancement of the  small bowel. Mild interloop ascites which is considered reactive.  There are scattered high-density areas within the small bowel which  is likely ingested material. No pneumobilia. No skip areas of  inflammation. Negative appendix.    Retroperitoneum: No mass or adenopathy.    Peritoneum: No ascites or pneumoperitoneum.    Vascular: No acute abnormality.    OSSEOUS: No acute abnormalities.  No sacroiliitis.     IMPRESSION:  1. Diffuse enteritis which could be infectious or inflammatory.  2. Cholelithiasis.  3. Hepatic steatosis      Electronically Signed    By: Jorje Guild M.D.    On: 04/28/2014 23:16         Verified By: Gilford Silvius, M.D.,    No Known Allergies:   Vital Signs/Nurse's Notes:  **Vital Signs.:   16-Oct-15 19:40  Temperature Temperature (F) 99.4  Celsius 37.4  Temperature Source oral  Pulse Pulse 92  Respirations Respirations 19  Systolic BP Systolic BP 518  Diastolic BP (mmHg) Diastolic BP (mmHg) 86  Mean BP 104  Pulse Ox % Pulse Ox % 99  Pulse Ox Activity Level  At rest  Oxygen Delivery Ventilator Assisted; 28%    Impression 39 year old female with history of DM2, HLD, depression, and schizophrenia who presented to [facility] ED on 04/29/2014 via EMS for respiratory failure. She was found down in her home for an unknown amount of time and unresponsive, but breathing on her own. Further reports indicate a letter was found written by her possibly indicating  a suicide attempt after apparently taking multiple antipsychotic medications. She has had multiple codes called today 2/2 episodes where she will brady down into a rhythm consisting of exclusive P waves.  She responds well to a short course of CPR each time returning to NSR. We are consulted for further evaluation.  1. 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 dopamine infusion would likely be of little clinical benefit -Family states they plan to withdraw care once the remaining family member arrives this evening -Chaplin at bedside   2. Apparent suicide attempt: -Note found from patient per nursing -EEG with diffuse slowing -No chance of meaningful recovery per neuro  3. Hypokalemia: -Corrected  4. Heavy vaginal discharge: -Per gyn   Electronic Signatures: Rise Mu (PA-C)  (Signed 16-Oct-15 17:28)  Authored: General Aspect/Present Illness, History and Physical Exam, Review of System, Family & Social History, Past Medical History, Home Medications, Labs, EKG , Radiology, Allergies, Impression/Plan Ida Rogue (MD)  (Signed 16-Oct-15 22:21)  Authored: General Aspect/Present Illness, History and Physical Exam, Review of System, Family & Social History, Health Issues, Labs, EKG , Radiology, Vital Signs/Nurse's Notes, Impression/Plan  Co-Signer: General Aspect/Present Illness, Home Medications, Allergies   Last Updated: 16-Oct-15 22:21 by Ida Rogue (MD)

## 2014-11-07 NOTE — Consult Note (Signed)
Pt seen and examined. Son present at bedside. Pt catotonic. Briefly discussed PEG with son. I am concerned about the legality of son or daughter giving consent. I am also concerned about patient pulling G tube prematurely, which can potentially lead to nasty peritonitis if patient was not tolerant of Dubhoff tube placement. Will follow. Thanks.  Electronic Signatures: Lutricia Feilh, Analucia Hush (MD)  (Signed on 28-Oct-15 14:04)  Authored  Last Updated: 28-Oct-15 14:04 by Lutricia Feilh, Macon Lesesne (MD)

## 2014-11-07 NOTE — H&P (Signed)
PATIENT NAME:  Mary Glenn, Mary Glenn MR#:  [id number] DATE OF BIRTH:  1975/08/23  DATE OF ADMISSION:  06/05/2014  IDENTIFYING INFORMATION: This patient is a 39 year old [place] female who currently lives with her children in [place], [place].   CHIEF COMPLAINT: "I don't remember that."   HISTORY OF PRESENT ILLNESS: This patient carries a diagnosis of primary psychotic disorder. On October 14th, the patient arrived to [facility] Emergency Department via EMS due to respiratory failure. The patient was unresponsive and required intubation. The reason for the respiratory failure was initially unknown. As this patient was hospitalized in August of 2015 to the behavioral health unit for an episode of catatonia, psychiatry was consulted. The initial psychiatric assessment was completed by Dr. Toni Amend on the medical floor. He stated that the family thought this was an intentional overdose in a suicidal attempt. The family found a suicide note that the patient had written but the family did not know what medication she had taken. During her last hospitalization in the psychiatric unit the patient had received Haldol decanoate and she was due to receive her second Haldol decanoate injection on September 24th, but apparently the patient never followed up with a provider after her discharge. (She was scheduled to follow up for a walk-in evaluation with [facility]) [name] had a prolonged hospitalization on the medical floor. She remained there from October 14th to November 20th. During her stay there, the patient was evaluated by many physicians including cardiology, neurology and OB/GYN. During her stay on the medical floor, apparently the patient coded multiple times, on 05/03/23. After the patient extubated, she displayed catatonic symptoms and therefore the psychiatrist on the floor started the patient on ECT. The patient then became delirious and the ECT was discontinued. Eventually the patient's delirium  cleared, but she still appeared withdrawn and depressed and therefore it was decided that once medically stable the patient was going to be transferred to the behavioral health unit for further stabilization and treatment. While on the medical floor, the patient was also started on Seroquel 300 mg at bedtime. Today the patient was interviewed. She was sitting on the bed and was calm and cooperative. The knew she was in the hospital, on the lower level, but did not know what unit, even though she has been in our unit before. The patient knew that it was 2015, but she did not know the time of the year or the day of the week. The patient states that she does not remember what took place prior to her hospitalization. She actually thought that she had never been discharged from her earlier stay here in the hospital in August. The patient denied any suicidality, homicidality, or psychosis. She denied any side effects from her medications. She denied the use of alcohol or illicit substances prior to admission.   PAST PSYCHIATRIC HISTORY: The patient was originally diagnosed with schizophrenia and hospitalized in [place]. She was discharged initially with Risperdal and citalopram; however, she did not to continue the treatment and never followed up. Family then moved to [place] and then was hospitalized again in our facility back in August of 2015. During that hospitalization, the patient did have psychosis, thought block, She was hyperreligious. She refused to eat. She refused medications and refused to leave her room. She was placed on nonemergency forced medications and received injectables for an extended period of time until eventually she started agreeing to take oral medications. She was discharged on Haldol decanoate, which she received on August  27. The patient was set to follow up with [facility] in [place], but she never followed up and never continued her treatment with Haldol decanoate. She did not receive  her second Haldol injection on September 24th. This patient does not have any known prior history of suicidal attempts.   PAST MEDICAL HISTORY: She has a history of tachycardia, GERD, and diabetes type 2. On her last hospitalization in August, she was started on metoprolol XL 50  mg p.o. daily, metformin 500 mg p.o. b.i.d., and ranitidine 150 mg p.o. daily. Today it was reported by nurses that the patient has a decubitus ulcer on her lower back of at least 3 to 4 cm. The patient also has decreased muscular strength, generalized, which is likely due to deconditioning and has been requiring to walk with a walker and with assistance. 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   FAMILY HISTORY: No known family history of mental illness or suicide.   SOCIAL HISTORY: The patient currently lives in [place] with her 2 adult children and a young daughter who is about 84 years old. Both of her older children are very supportive and have been involved with the patient's care. The patient has been unemployed for an extended period of time and it is to my knowledge that the patient is currently a legal immigrant. She has Medicaid pending and has a Orthoptist.   ALLERGIES: No known drug allergies.  REVIEW OF SYSTEMS: The patient denies nausea, vomiting and diarrhea. She denies pain but does complain of some weakness and urinary incontinence at times. The rest of the 10 system review is negative.   MENTAL STATUS EXAMINATION: The patient is a 39 year old woman who appears much older than her stated age. She is disheveled. She is wearing hospital scrubs and her hair is disheveled. Behavior: She was calm, passive sitting on her bed following commands directly. Psychomotor activity retarded. Thought process is concrete. Thought content is negative for suicidality or homicidality. Perception is negative for psychosis. Mood is described as euthymic. Her affect  appears restricted. Insight and judgment are limited. Cognitive examination: She is alert. She is oriented in person, partially to place, not oriented to situation.   PHYSICAL EXAMINATION: VITAL SIGNS: Blood pressure 106/72, respirations 20, pulse 106, temperature 98.4.  MUSCULOSKELETAL: The patient has a slow gait and due to deconditioning needs to walk with a walker. She has normal muscular tone and there is no evidence of involuntary movements.  GENERAL APPEARANCE: The patient is a 39 year old Hispanic female, well-nourished, in no acute distress.   LABORATORY RESULTS: Her most recent basic metabolic panel was obtained on November 16th. Her BUN was 8, creatinine 0.69, sodium 140, potassium 3.2, calcium 9.  WBC 8.4, hemoglobin 10.8, hematocrit 32, platelet count 290,000. Most recent LFTs were obtained on November 13th. AST was 19, ALT 28, alkaline phosphatase was increased at 117 and albumin was 3.2.   DIAGNOSES: AXIS I:  1.  Schizoaffective disorder, depressed type. 2.  Noncompliant with medications. 3.  Status post respiratory failure due to overdose. 4.  Diabetes type 1. 5.  Hypertension. 6.  Deconditioning. 7.  Decubitus ulcer.   PLAN: This patient has been transferred from the medical floor to the psychiatric unit in order for her to continue psychiatric treatment. At this point in time, ECT treatments will not be continued as the patient had developed delirium after 7 treatments. Today, the patient is less confused, what she has been  on the medical floor. However, she is still disoriented to place and date. It is possible that this patient has suffered from an anoxic brain injury as she had respiratory failure and coded several times on the medical floor.   For schizoaffective disorder, my plan will be to rethink her antipsychotic. As I mentioned earlier in my dictation, during her prior presentation in August the patient was quite psychotic, had significant thought blocking and was  delusional. I am concerned about maintaining treatment with Seroquel which tends to be weaker antipsychotic. This patient also has this long history of being noncompliant with treatment. I think she will benefit from having a long acting injectable medication; therefore, I will consider restarting her back on haloperidol, which is the medication that she was stabilized on back in August. At that time, the medication was titrated up to 10 mg a day and the patient tolerated the medication without any side effects or EPS.   For depression, the patient will be continued on an antidepressant.   For diabetes, the patient will be continued on metformin 500 mg p.o. b.i.d.   For tachycardia and hypertension, the patient will be continued on metoprolol 50 mg p.o. daily.  For deconditioning, physical therapy has been consulted.  For decubitus ulcer, a wound care nurse has been consulted. Orders have been given to reposition [name] every 2 hours and not to allow her to sleep at night on her back. Overall this patient is in need of physical rehabilitation and we believe that most likely her disposition will be a stepdown from the hospital instead of returning home; therefore, we have already started the process of transferring the patient to a nursing home where she can continue physical therapy and can heal from her decubitus ulcer.   SOCIAL ISSUES: The patient has already applied for Medicaid and the Medicaid has been pending. A PPD will be placed today and social worker has started the process to obtain a PASRR. ____________________________ Jimmy FootmanAndrea Hernandez-Gonzalez, MD ahg:sb D: 06/05/2014 13:13:44 ET T: 06/05/2014 13:34:57 ET JOB#: [id number]  cc: Jimmy FootmanAndrea Hernandez-Gonzalez, MD, <Dictator> Horton ChinANDREA HERNANDEZ GONZAL MD ELECTRONICALLY SIGNED 06/09/2014 20:37

## 2014-11-07 NOTE — Consult Note (Signed)
Psychiatry: PAtient seen and chart reviewed and case discussed with nursing. Patient interviewed with Spanish interpreter but family not present. would not speak to me. Also would not move to request.  explained her current condition and our belief that she had tried to kill herself and my understanding that she has a psychotic depression. Patient did not really respond to that. When I discussed her lack of po intake and the crutial need for nutrition she showed some sad emotion. She shook her head "no" at my explanation of an NG feeding tube. I told her she could die without nutrition and asked if that was what she wanted and she nodded her head "yes".  PSychotic depression with catatonia is present, I believe. I think she is still an active suicide risk. Given the severity of the illness and concern of her not eating I think ECT would be best option. PAtient has no capacity to make resonable decisions for medical care now. Decisions about care including tube placement should be referred to daughter.  I will discuss ECT with daughter as soon as I can. I will not start antipsychotics or other meds yet to avoid side effects in hope we can move to ECT soon. If daughter declines then will have to find a plan "B" for mental illness.  Continue sitter for safety. Current level of nursing needs make her unsuitable for BHU transfer.  Electronic Signatures: Audery Amellapacs, Sonnia Strong T (MD)  (Signed on 27-Oct-15 23:41)  Authored  Last Updated: 27-Oct-15 23:41 by Audery Amellapacs, Ollin Hochmuth T (MD)

## 2014-11-07 NOTE — Consult Note (Signed)
Psychiatry: Follow up. Today patient seen with a Spanish language interpreter and I also spoke with the family. PAtient reportedly a little more interactive with family earlier in the day. Seemed to make eye contact with me but did not speak. Not data for ROS. PAtient remains awake but unresponsive and basically catatonic. scheduled for tomorrow. Continue medication otherwise. Answered questions from family.  Electronic Signatures: Audery Amellapacs, John T (MD)  (Signed on 03-Nov-15 22:17)  Authored  Last Updated: 03-Nov-15 22:17 by Audery Amellapacs, John T (MD)

## 2014-11-07 NOTE — Consult Note (Signed)
   Comments   Pt again became pulseless and code blue was called. It appears she became bradycardic with cessation of ventricular activity. She quickly had ROSC with CPR. Case discused with attending. Labs rechecked. Cardiology consulted and I discussed her case with Dr Rockey Situ. ? if she would benefit from TCP to avoid recurrent arrest (at least until family arrives). again met with pt's daughter and she called her aunt. Family would like to continue supportive care and full code "at least until my uncle arrives". I think family may then be open to a conversation about alternative management.  30 minutes   Electronic Signatures: Borders, Kirt Boys (NP)  (Signed 16-Oct-15 16:22)  Authored: Palliative Care   Last Updated: 16-Oct-15 16:22 by Irean Hong (NP)
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
## 2014-11-07 NOTE — Consult Note (Signed)
Psychiatry: Follow-up for this patient who has been receiving ECT for catatonia.  This morning I was informed by the ECT nursing staff that the patient appeared to be extremely confused.  Based on that I elected to not perform ECT today.  On evaluation accompanied by an interpreter today later in the morning the patient had no specific new complaints.  She stated that her mood was fine.  She denied having any suicidal ideation.  She was sitting up in bed and was able to participate in a interview. is not acutely reporting any specific physical symptoms except feeling that her legs are weak.  She is still unable to stand independently or ambulate independently because of deconditioning in her legs.  Phone mental status patient was neatly groomed.  She sat up independently on the side of the bed and participated in the interview for several minutes.  Made intermittent good eye contact.  Psychomotor activity a little slow.  Speech slow and decreased in amount.  Affect slightly blunted but not sad not dysphoric not tearful.  Patient states that her mood feels fine.  She denies any suicidal ideation.  She does state that she is hearing voices and receiving messages.  Her thoughts are extremely disorganized.  Patient answered many questions with frankly bizarre answers although intermittently she was able to give more rational answers.  She was able to name objects correctly.  After [place]  she was able to remember it a few minutes later. this point the patient's mood seems to be much improved.  Catatonia seems to be essentially resolved.  She remains however very psychotic.  It is a little bit hard to distinguish the psychosis from the delirium.  My general impression however is that this is more psychosis than delirium. anticipation of possible transfer to the psychiatry ward I have inquired about her contact precautions.  She is currently on contact precautions because of Acinetobacter  found in a vaginal secretion.  Nurse Huntley DecSara wall was unable to identify any clear resolution to that.  She suggested that I asked for a gynecology consult to make sure that appropriate treatment has been done. this point I'm going to hold off on ECT so as not to worsen any cognitive symptoms.  I plan to increase her antipsychotic medicine.  She needs to be getting active physical therapy to improve her strength.  We may be able to consider transfer to psychiatry especially if she becomes more mobile within the next few days. ECT is not out of the question but at this point her depressive symptoms are improved and so I will hold off while focusing on treating the psychosis.  Electronic Signatures: Clapacs, Jackquline DenmarkJohn T (MD)  (Signed on 16-Nov-15 16:33)  Authored  Last Updated: 16-Nov-15 16:33 by Audery Amellapacs, John T (MD)

## 2014-11-07 NOTE — Consult Note (Signed)
Psychiatry: Follow up for this woman with psychosis and depression. Since halting ECT patient has not returned to catatonia. She is eating well and communicating. On interview today she says her mood is not sad or depressed. Denies any suicidal thought. Admits she is still hearing voices and getting messages. Has not acted out or shown any disruptive behavior.  a little flat. Thoughts still disorganized and odd. Taking and tolerating meds. had her get up and walk around her room today and she was able to do so without assistance. this time I think [facility] will [facility]. Continue current antipsychotic medication. Consider possible maintainence ECT on Friday. Discussed with intake staff.  Electronic Signatures: Clapacs, Jackquline DenmarkJohn T (MD)  (Signed on 18-Nov-15 16:51)  Authored  Last Updated: 18-Nov-15 16:51 by Audery Amellapacs, John T (MD)

## 2014-11-07 NOTE — Consult Note (Signed)
Psychiatry: Follow-up no for this 39 year old woman with major depression with psychosis versus bipolar versus schizophrenia.  Since my last evaluation her clinical condition and psychiatrically has not changed.  She continues to be mostly unresponsive.  She will occasionally respond with small movements and possibly brief verbalization to her children but not to clinical staff.  Patient continues to refuse to take any nutrition by mouth.  They have placed a Dobbhoff tube through her nose into her stomach and the patient is for the time being not trying to pull it out. had been to begin ECT treatment this morning for catatonia in psychotic depression.  Concern was raised because of the history of asystole and arrhythmia wall she was in the critical care unit.  Case discussed with anesthesia.  Cardiology consult reviewed.  No evidence of active cardiac disease.  Given the precise circumstances I don't think we can predict the exact risk in terms of cardiac outcome from ECT. discussed with Dr. Amado CoeGouru of the hospitalist service.  She and I are in agreement that there really is no effective alternative treatment for the patient's condition.  ECT remains indicated.  Without effective treatment risks of malnutrition, infection, electrolyte imbalance all remain high with an increased risk of mortality particularly as there really is no available disposition for active treatment.  I have discussed the case with both of her adult children advising them that there is an increased risk of cardiac complications including arrhythmia or myocardial infarction or death. recommendation is that we proceed with ECT as I think the potential benefit outweighs the risk.  We will follow the recommendations of the most recent cardiology note with pacing pads and dopamine and atropine readily available as well as the full crash cart and all usual resources during anesthesia.  Both adult children give their continued consent to the  treatment under this situation.  I have interviewed the patient with the assistance of a Spanish language interpreter and explained it to her as well.  As expected she had no response.  I will I will not be in the hospital over the weekend and the consult service will be handled by the specialist on call, but I am always available on my pager or my cell phone for any concerns or need regarding this patient and the psychiatric plan. 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

## 2014-11-07 NOTE — Consult Note (Signed)
PATIENT NAME:  Mary Glenn, Mary Glenn MR#:  [id number] DATE OF BIRTH:  1975/12/20  DATE OF CONSULTATION:  05/05/2014  REFERRING PHYSICIAN: Dr. Vianne Bulls  CONSULTING PHYSICIAN:  Cheral Marker. Ola Spurr, MD  REASON FOR CONSULTATION: Conjunctivitis, vaginal discharge, and sepsis.   HISTORY OF PRESENT ILLNESS: History is obtained from review of the charts and discussion with the patient's son. This is a 39 year old female with history of psychiatric illness including schizophrenia on multiple medication, who was admitted 10/14 after apparently attempting suicide with unknown medications. She was brought to the Emergency Room after being found by her daughter. She was intubated and has been in the Intensive Care Unit since that time. She has also had issues with vaginal discharge and was seen by gynecology. Cultures were mixed growing various organisms. She has been maintained on IV antibiotics and has come off pressors. Her white count, which was initially quite elevated, has improved. Her fevers have also improved. She is slowly starting to wake up from the ventilator. She has been seen by neurology as well.   Per my discussion with her son, she lives with her 3 children in a trailer. She does not do many activities, mainly gets out of bed in the morning, eats and then goes back bed. She has had no recent travel but immigrated here 10 to 15 years ago from [place]. He used to work in [employer] but does not work currently. There are no pet animals at home and he denies any known animal exposures. He states that for the few days prior to this event his mother was feeling "fatigued and shaky" but otherwise had not complained to him of anything.   PAST MEDICAL HISTORY: 1.  Schizophrenia.  2.  Diabetes.  3.  Hypertension.  4.  Hyperlipidemia.   PAST SURGICAL HISTORY: Uterine surgery following the birth of her second child.   FAMILY HISTORY: Noncontributory.   REVIEW OF SYSTEMS: Unable to obtain.   SOCIAL  HISTORY: As above. She does not smoke, drink or use drugs per her son.   ANTIBIOTICS SINCE ADMISSION: Include:  1.  Ciprofloxacin eye drops begun October 14 through the present.  2.  Levofloxacin October 14 through the 19.  3.  Linezolid October 19 begun.  4.  Metronidazole October 13 through the 19.  5.  She has also been on prednisone since the 18th of 20 mg once a day.  6.  She received vancomycin starting October 13.   ALLERGIES: No known drug allergies.   PHYSICAL EXAMINATION: VITALS: T-max is 100.3, currently 98.8, pulse 59, blood pressure 140/66, respirations 26, sat 96% on a ventilator.  GENERAL: She is critically ill-appearing. She is lying in bed. Her eyes are awake and she is able to respond somewhat. She has impressive suffusion of her conjunctivae. She has some injection. Her pupils are reactive.  OROPHARYNX: An ET tube in place. It is difficult to assess.  NECK: Appears supple.  HEART: Regular.  LUNGS: Coarse breath sounds bilaterally.  ABDOMEN: Somewhat distended, soft, nontender. She has a rectal tube in place. She has a Foley catheter in place. I am unable to assess her vaginal area at this point, but per nursing, there does seem to be improvement in the vaginal discharge.  EXTREMITIES: There is 1+ edema bilaterally.   DATA: Microbiology: Blood cultures on admission October 13, no growth to date. Wet prep done October 13 showed a  few clue cells, but no yeast or trichomonas. Urine culture done October 13 had 3000 colony-forming units  of gram-negative rods. Vaginal discharge October 14 grew enterococcus sensitive to ampicillin, Klebsiella pneumoniae, Escherichia coli, and Acinetobacter sensitive to levofloxacin. Followup vaginal culture grew enterococcus resistant to vancomycin, but sensitive to levofloxacin and ampicillin, Klebsiella sensitive to levofloxacin, Escherichia coli sensitive to levofloxacin, and Kluyvera sensitive to levofloxacin. There is also moderate growth of  yeast. Blood cultures October 17 show no growth to date. Urine culture repeat October 17 no growth. Clostridium difficile October 20 negative. White blood count on admission was 32,000 with 92% neutrophils, currently it is 6.7, hemoglobin 11.0, platelets 171. ESR of 68. HIV was negative. Clostridium difficile negative. Renal function on admission showed a creatinine of 0.74, currently is 0.41. Sodium is elevated at 146. Ammonia level currently 44, it was 71. Ethanol level on admission was negative. On admission, LFTs showed alkaline phosphatase 143, AST 126, ALT 153. Currently most recent LFTs October 16 showed albumin 2.5, total protein 6.2, AST only slightly elevated at 44, ALT normal, and alkaline phosphatase normal. Bilirubin is normal. CPK on admission was normal, but on October 18 was 3542 and repeat 1188. Toxicology screen was negative.   IMAGING: CT of the abdomen and pelvis showed diffuse enteritis which could be infectious or inflammatory. There was also cholelithiasis and hepatic steatosis, this was from October 13. Pelvic ultrasound October 14 showed grossly unremarkable uterus and left ovary, right ovary not visualized. CT of the head on admission showed no acute intracranial process. Chest x-ray following intubation showed no acute cardiopulmonary process. X-ray of her abdomen showed no radiopaque foreign body fragments. Upper extremity Doppler testing showed superficial venous thrombosis in the left cephalic vein.   IMPRESSION: A 39 year old female with a history of schizophrenia as well as diabetes admitted after being found unresponsive and presumably attempting suicide with unknown medications or ingestions. Her prior history is vague but per her son, she had been feeling relatively ill with some shakes and fatigue for a few days prior. There are no other unusual exposures I can elicit from history, although she lives in a trailer with her children. Her HIV test here is negative. It is unclear  of her sexual activity. She also has been found to have a vaginal discharge which grew multiple organisms, as well as an enteritis on CAT scan and diarrhea now presumed due to lactulose. She has also developed impressive conjunctival swelling consistent with suffusion. She has also had elevated CPK and ESR with a potential myositis and has been started on prednisone. She has been covered broadly with levofloxacin, Flagyl, and vancomycin. She has been started on linezolid as well for VRE. Her hemodynamics are stable and her white count has improved from 30 to 7.    RECOMMENDATIONS: 1.  Consolidate antibiotics. Continue just levofloxacin and Flagyl. We can discontinue the vancomycin and linezolid. The VRE was sensitive to levofloxacin and that should cover.  2.  Check also RPR and hepatitis B and C serologies.  3.  Will check leptospirosis serologies. Leptospirosis can cause sepsis as well as conjunctival suffusion. Clinically, she does not have Weil's disease. She does not have  renal failure and liver failure but she did have elevated LFTs when she came in.  4.  She will need further evaluation when she is extubated and more stable to look for any kind of rectovaginal discharge, although I do not get a history of issues with this prior.  5.  Would suggest stool culture, as that has not been done.   Thank you for the consultation. I will  be glad to follow with you.    ____________________________ Cheral Marker. Ola Spurr, MD dpf:at D: 05/05/2014 14:54:37 ET T: 05/05/2014 15:58:46 ET JOB#: [id number]  cc: Cheral Marker. Ola Spurr, MD, <Dictator> Tina Temme Ola Spurr MD ELECTRONICALLY SIGNED 05/12/2014 9:55

## 2014-11-07 NOTE — Consult Note (Signed)
PATIENT NAME:  Mary Glenn, Mary Glenn MR#:  [id number] DATE OF BIRTH:  [birth year]  DATE OF CONSULTATION:  04/29/2014  REFERRING PHYSICIAN:  Dr. Allena Katz CONSULTING PHYSICIAN:  Ezzard Standing. Bluford Kaufmann, MD, Ranae Plumber Arvilla Market, ANP (Adult Nurse Practitioner)   REASON FOR CONSULTATION: Diffuse enteritis.   HISTORY OF PRESENT ILLNESS: This 39 year old patient was brought to the Emergency Room via EMS for respiratory failure. She was found unresponsive at her home by her child. She was down for an unknown period of time. She was intubated by the EMS. She was found to be hypoxic and obtunded in the ER. A suicide note was found where it is believed that the patient overdosed on her schizophrenia medication. The admission laboratory studies showed leukocytosis with WBC 32.3, urinalysis with positive 2 ketones, salicylate level less than 1.7, acetaminophen less than 2, Ethanol <3,  lactic acid 3.9. The CT of the head showed no acute intracranial process. The CT of the abdomen and pelvis with contrast revealed diffuse thickening and hyperenhancement of the small bowel. Mild interloop ascites, which is considered reactive, was also present. The appendix was negative. No pneumobilia. No skip areas of inflammation. There was no ascites. There was  positive cholelithiasis and hepatic steatosis. The patient also underwent a pelvic ultrasound because of copious green vaginal discharge to rule out a fistula, and the ultrasound was normal. GI has been consulted regarding the enteritis finding.   This patient has remained unresponsive. She remains intubated. She has been seen by several specialists including GYN. The patient is here alone as her daughter went back home. There is no history of diarrhea or abdominal complaints prior to this admission. The etiology of the enteritis is unclear at this time to rule out usual causes of infection, inflammation. There was no documentation of significant hypotension or hypoperfusion state, although  that needs to be considered as well as patient's preadmission history is vague. Her vital signs have been unremarkable during this admission and she has not required pressors.   PAST MEDICAL HISTORY: 1.  Hypertension.  2.  Diabetes mellitus.  3.  Schizophrenia.  4.  Vaginal delivery x 3.  PAST SURGICAL HISTORY: Uterine operation following the birth of her second child about 7 or 8 years ago.   HOME MEDICATIONS: 1.  Benztropine 1 mg 1 tablet twice daily.  2.  Clonazepam 1 mg at bedtime.  3.  Haldol 100 mg intramuscularly once a month.  4.  Haldol 5 mg 1 tablet twice daily.  5.  Meloxicam 15 mg daily x 14 days.  6.  Metformin 500 mg twice daily.  7.  Metoprolol succinate 50 mg extended-release 1 tablet daily.  8.  Ranitidine 150 mg 1 tablet daily.   ALLERGIES: NKDA.   HABITS: The patient lives with her 3 children, 65 year old, 57 year old, and about a 84-year-old. She does not have a history of alcohol, tobacco, or drug use according to her daughter on admission H and P. History is obtained per chart review.   LABORATORY: Admission laboratory studies 04/28/2014, glucose 257, BUN 8, creatinine 0.74, potassium 3.0, CO2 is 16, beta-hCG less than 1, albumin 4.4, alkaline phosphatase 143, AST 126, ALT 153. CPK total is negative. Drug screen is negative. WBC 32.3, hemoglobin 15.5. Neutrophils elevated at 29.9, monocytes 1.3. Pro time 15, INR is 1.2, pro time 15, PTT 27.8. Vaginal wet prep shows a few WBCs cells, positive clue cells seen, no yeast, no trichomonas, no spermatozoa. Chlamydia is negative. Gonorrhea is negative. Vaginal discharge aerobic culture with  rare WBCs, rare gram-positive cocci in pairs and chains, a few gram variable rods.   RADIOLOGY: CT as noted with diffuse thickening and hyperenhancement of the small bowel. Mild interloop ascites, which is considered reactive. This is considered diffuse enteritis with infectious versus inflammatory etiology, positive cholelithiasis. No  evidence of gallbladder inflammation or obstruction. Hepatic steatosis. Chest x-ray with stable positioning of support apparatus. No pneumothorax. No focal airspace opacity. Pelvic ultrasound showed moderate pelvic ascites, otherwise unremarkable.   PHYSICAL EXAMINATION: VITAL SIGNS: 98.9, 87, 28, 129/94, pulse oximetry on ventilator is 100%.  GENERAL: Middle-aged Hispanic female is critically ill. She is unresponsive on ventilator.  HEENT: Head is normocephalic. Pupils remained constricted bilaterally. She has eye fluttering noted. A little injected conjunctivitis. Oral mucosa is moist. NG tube is draining clearish yellow discharge. No blood or coffee grounds noted.  CARDIOVASCULAR: Heart tones are S1, S2 without murmur or gallop.  LUNGS: CTA anteriorly. Sputum on suctioning is clear to white.  ABDOMEN: Soft, somewhat hypoactive bowel sounds, but they are present. With palpation unable to palpate any distention or mass. The patient is unresponsive and not able to identify any tenderness.  RECTAL: Exam performed showed normal anus. There was no evidence of a fissure, tear, fistula or drainage areas with palpation. This is normal. Internal rectal exam with curd-like whitish, thick, small discharge. This did not appear stool like. There was no odor. No blood or obvious fecal material. No green discharge noted. Smooth walls without rectal mass. I could not identify a fistula opening.  EXTREMITIES: Lower extremities without lower extremity edema.  MUSCULOSKELETAL: No joint inflammation or swelling noted.  NEUROLOGIC: The patient does not follow commands, pupils are pinpoint, eyelids flutter randomly. The patient is not moving extremities or following any type of commands.   IMPRESSION/PLAN:  501.  A 39 year old patient with history of schizophrenia, left suicide note and apparently took her psychiatric medications as an overdose attempt. She presents with hypoxia and is intubated and unresponsive.   2.   She presents with elevated white blood cells, sepsis, and enteritis per CT study.She is receiving appropriate antibiotics. Monitor response. The patient does not present with history of diarrhea or inflammatory bowel disease. She does present with sepsis and metabolic derangement. She has adequate blood pressure. 3.   She does have a history of some type of uterine infection following her last childbirth, was about 7 or 8 years ago. She has been evaluated by GYN for copious vaginal discharge, and there is concern over possible rectovaginal fistula. A pelvic ultrasound was unremarkable. CT did not show evidence. Physical exam shows digital rectal exam with thick, curd-like, white discharge in the rectum. Apparently, she had green, thick, curd-like discharge from the rectum and vagina. I could not identify any evidence of fistula. There was no anal stricture.  This can be further evaluated with an MRI. Further gastrointestinal testing per Dr. Bluford Kaufmannh recommendation.  4.  The patient has GI prophylaxis with Zantac via NG tube.  5.  Stool for culture if patient is clinically more stable. An MRI can be considered. Barium enema may be difficult. She is receiving metronidazole currently.   This case was discussed with Dr. Bluford Kaufmannh in collaboration of care. The recommendation is to continue antibiotic and monitor clinical course. Thank you for this consultation.   These services provided by Cala BradfordKimberly A. Arvilla MarketMills, MS, APRN, BC, ANP (Adult Nurse Practitioner) under collaborative agreement with Ezzard StandingPaul Y. Bluford Kaufmannh, MD.   ____________________________ Ranae PlumberKimberly A. Arvilla MarketMills, ANP (Adult Nurse Practitioner) kam:at  D: 04/29/2014 17:10:52 ET T: 04/29/2014 17:59:08 ET JOB#: [id number]  cc: Cala Bradford A. Arvilla Market, ANP (Adult Nurse Practitioner), <Dictator> Ranae Plumber Suzette Battiest, MSN, ANP-BC Adult Nurse Practitioner ELECTRONICALLY SIGNED 04/30/2014 9:15

## 2014-11-07 NOTE — Consult Note (Signed)
PSychiatry: PAtient with severe depression with psychosis possible bipolar being treated with ECT. ECT today preformed, bilateral treatment tolerated well with no complications. This afternoon patient has no new complaints. Able to speak. Eating some. Drinking water. Reports no pain and not feeling depressed and denies any SI. Affect flat and still a little confused. sign of stillness to exam. Tolerating risperdal well.  continues to improve with each treatment. For now anticipate treatment again on Friday. Continue medication. Tomorrow will try to have a more detailed talk with her about medication management.  Electronic Signatures: Audery Amellapacs, John T (MD)  (Signed on 11-Nov-15 21:03)  Authored  Last Updated: 11-Nov-15 21:03 by Audery Amellapacs, John T (MD)

## 2014-11-07 NOTE — Consult Note (Signed)
Psychiatry: Follow-up for this patient with a history of recent psychosis and depression status post suicide attempt.  Patient unable to communicate.  Discussed case with Dr. Sampson GoonFitzgerald and reviewed chart.  Attempted to interact with patient.  She does not seem to have had any dramatic change since yesterday.  Today she did not respond to me when I spoke to her verbally at all. patient has had a recent history of clear psychosis with a diagnosis of schizophrenia which possibly could also be psychotic depression or bipolar disorder.  In her current state it may be hard to distinguish catatonia from medical encephalopathy.  There could be a case made for adding antipsychotics at some point on the.  That her psychosis and depression are actually contributing to her poor mental state making it more difficult to extubate her.  However on the conservative side I would not want to over sedate her or add unnecessary and potentially harmful medication.  I will continue to monitor her situation.  Dr. Katrinka BlazingSmith also continues to follow and treat for the encephalopathy. Delirium related to overdose.  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  Currently unable to do further evaluation because of intubation.  Electronic Signatures: Audery Amellapacs, Armel Rabbani T (MD)  (Signed on 21-Oct-15 15:20)  Authored  Last Updated: 21-Oct-15 15:20 by Audery Amellapacs, Sylas Twombly T (MD)

## 2014-11-07 NOTE — Consult Note (Signed)
PSychiatry: PAtient seen and chart reviewed. Interviewed with interpreter. PAtient is noted to be more withdrawn today. Still eating but affect flatter. On interview she denies being depressed and denies any suicidal thought but she clearly states that she is having auditory hallucinations and receiving commands from "the Ten".  working but I actually think what we are seeing today may be a regression with increased psychosis, I am going to increase the amount of risperdal she is getting and change anesthetics to try to get more robust seizures as her last time was down to the edge of 20 seconds. Discussed with patient. Support given. Next ECT tomorrow  Electronic Signatures: Nahara Dona, Jackquline DenmarkJohn T (MD)  (Signed on 13-Nov-15 00:05)  Authored  Last Updated: 13-Nov-15 00:05 by Audery Amellapacs, Deniel Mcquiston T (MD)

## 2014-11-07 NOTE — Consult Note (Signed)
Psychiatry: Follow-up for this patient with psychotic depression or schizophrenia.  She continues to be intubated.  Still unable to communicate.  No psychiatric change.  No indication to change specific treatment.  We will continue to follow up after the weekend.  Electronic Signatures: Clapacs, Jackquline DenmarkJohn T (MD)  (Signed on 23-Oct-15 19:31)  Authored  Last Updated: 23-Oct-15 19:31 by Audery Amellapacs, John T (MD)

## 2014-11-07 NOTE — Consult Note (Signed)
Psychiatry: PAtient seen and chart reviewed. Case discussed with Dr Harvie JuniorPhifer and conversation with patient's daughter. Patient has been transferred from CCU to room 244 as she is adequately hempdynamically stable. She continue to take no nourishment. GI plans placement of PEG tube. on my assessment patient has a major depression with psychotic features which may be part of bipolar disorder. She currently has a catatonic presentation and a life=threatenong risk from refusal of food. I also find that she has no demonstratable capacity to make informed decisions on her on behalf as she is unable to communicate any rational conversation. of choice in this situation is ECT. I discussed this with Lafonda MossesDiana, her daughter, who is willing to sign consent for ECT for her mother as closest relative. will plan to initiate bilateral ECT on Friday morning. Will follow. Corporate investment bankerContinue sitter.  Electronic Signatures: Brenan Modesto, Jackquline DenmarkJohn T (MD)  (Signed on 28-Oct-15 22:05)  Authored  Last Updated: 28-Oct-15 22:05 by Audery Amellapacs, Thressa Shiffer T (MD)

## 2014-11-07 NOTE — Consult Note (Signed)
Psychiatry: Follow-up for this patient with schizophrenia versus severe psychotic depression.  Previously responded well to ECT.  It has been over a week since her last treatment.  On interview today the patient states she is doing okay.  Doesn't have a lot to say.  I did speak to her with a Spanish language interpreter.  Affect is slightly more blunted than I had seen it before.  Patient denies suicidal ideation or hallucinations.  I proposed to her that maintenance ECT could help to maintain the benefit she got a little bit longer.  Since she is in the hospital I have wanted to do some maintenance before she leaves.  I explained this to the patient and she is agreeable.  I told her very clearly that she should not eat or drink anything after midnight tonight so that we can do the treatment tomorrow.  I will try to see if we can get a sitter in place as well.  Spoke with ECT staff and she will be scheduled for tomorrow.  Electronic Signatures: Kaeya Schiffer, Jackquline DenmarkJohn T (MD)  (Signed on 03-Dec-15 16:11)  Authored  Last Updated: 03-Dec-15 16:11 by Audery Amellapacs, Edwin Cherian T (MD)

## 2014-11-07 NOTE — Consult Note (Signed)
Chief Complaint:  Subjective/Chief Complaint Remains on vent and unresponsive. WBC coming down. LFT now normal.   VITAL SIGNS/ANCILLARY NOTES: **Vital Signs.:   15-Oct-15 10:00  Vital Signs Type Routine  Temperature Source oral  Pulse Pulse 84  Respirations Respirations 20  Systolic BP Systolic BP 146  Diastolic BP (mmHg) Diastolic BP (mmHg) 85  Mean BP 105  Pulse Ox % Pulse Ox % 100  Oxygen Delivery Ventilator Assisted  CO2 Monitor CO2 Monitor 21   Brief Assessment:  GEN no acute distress   Cardiac Regular   Respiratory clear BS   Gastrointestinal Normal   Lab Results: Lab:  15-Oct-15 04:30   pH (ABG) 7.390 (7.350-7.450 NOTE: New Reference Range 02/07/14)  PCO2  25 (32-48 NOTE: New Reference Range 02/24/14)  PO2  129 (83-108 NOTE: New Reference Range 02/07/14)  FiO2 45  Base Excess  -8 (-3-3 NOTE: New Reference Range 02/24/14)  HCO3  15.1 (21.0-28.0 NOTE: New Reference Range 02/07/14)  O2 Saturation 98.2  O2 Device mech vent  Specimen Site (ABG) RT RADIAL  Specimen Type (ABG) ARTERIAL  Patient Temp (ABG) 37.0  Mode ASSIST CONTROL  Vt 450  PEEP 5.0  Mechanical Rate 14 (Result(s) reported on 30 Apr 2014 at 04:47AM.)   Radiology Results: US:    14-Oct-15 15:37, US Pelvis - NON OB  US Pelvis - NON OB   REASON FOR EXAM:    moderate vaginal discharge, possible fistula   formation, assess uterine cavty  COMMENTS:       PROCEDURE: US  - US PELVIS EXAM  - Apr 29 2014  3:37PM     CLINICAL DATA:  Moderate vaginal discharge, possible fistula  formation    EXAM:  TRANSABDOMINAL ULTRASOUND OF PELVIS    TECHNIQUE:  Transabdominal ultrasound examination of the pelvis was performed  including evaluation of the uterus, ovaries, adnexal regions, and  pelvic cul-de-sac.    COMPARISON:  None.    FINDINGS:  Uterus    Measurements: 7.8 x 4.8 x 3.5 cm. Poorly visualized. No fibroid/mass  is seen.    Endometrium    Thickness: 8 mm.  No focal abnormality  visualized.    Right ovary  Not discretely visualized.    Left ovary    Measurements: 3.2 x 2.3 x 2.4 cm. Normal appearance/no adnexal mass.    Other findings:  Moderate pelvic ascites.     IMPRESSION:  Grossly unremarkable uterus and left ovary.    Right ovary is not discretely visualized.    Moderate pelvic ascites.  Electronically Signed    By: Charline BillsSriyesh  Krishnan M.D.    On: 04/29/2014 16:08         Verified By: Charline BillsSRIYESH . KRISHNAN, M.D.,   Assessment/Plan:  Assessment/Plan:  Assessment S/P suicide attempt. Unresponsive. Enteritis? Rectovaginal fistula? No evidence on CT or U/S so far.   Plan Little to add from GI point of view other than continue antibiotics for now.   Electronic Signatures: Lutricia Feilh, Meline Russaw (MD)  (Signed 15-Oct-15 14:51)  Authored: Chief Complaint, VITAL SIGNS/ANCILLARY NOTES, Brief Assessment, Lab Results, Radiology Results, Assessment/Plan   Last Updated: 15-Oct-15 14:51 by Lutricia Feilh, Elmo Rio (MD)

## 2014-11-07 NOTE — Consult Note (Signed)
ECT: Bilateral ECT today performed as part of maintainence treatment. Tolerated well with no complications. Mood reorted as ok and affect euthymic. Patient continues to await placement.  Electronic Signatures: Clapacs, Jackquline DenmarkJohn T (MD)  (Signed on 04-Dec-15 23:22)  Authored  Last Updated: 04-Dec-15 23:22 by Audery Amellapacs, John T (MD)

## 2014-11-07 NOTE — Consult Note (Signed)
Brief Consult Note: Diagnosis: schizophrenia.   Comments: Psychiatry: Patient seen. Chart reviewed. PAtient recently discharged from hospital with dx schizophrenia. Currently intubated. Will follow and attempt interview once she is able to speak. Meanwhile, I suggest a sitter.  Electronic Signatures: Griselda Bramblett, Jackquline DenmarkJohn T (MD)  (Signed 14-Oct-15 17:28)  Authored: Brief Consult Note   Last Updated: 14-Oct-15 17:28 by Audery Amellapacs, Kristyanna Barcelo T (MD)

## 2014-11-07 NOTE — Consult Note (Signed)
Psychiatry: Patient seen and chart reviewed. Patient receiving ECT for major depression with psychosis. Continue to improve. States she is feeling better. Mood is ok. Denies any suicidal ideation or wish to die. Denies any hallucinations. Affect blunted and tired. Speach quiet. Thoughts slow.  was up out of bed today and continues to improve po intake. improved but still not at likely baseline. Discussed plan with patient . I would like to continue treatment through tomorrow as she has been improving with every treatment so far. Continue remeron and risperdal.  Electronic Signatures: Clapacs, Jackquline DenmarkJohn T (MD)  (Signed on 10-Nov-15 21:29)  Authored  Last Updated: 10-Nov-15 21:29 by Audery Amellapacs, John T (MD)

## 2014-11-07 NOTE — Consult Note (Signed)
ECT: Patient had been scheduled for ECT treatment today for maintenance.  She drank water this morning prior to the treatment.  This is the second scheduled treatment that has been canceled because of her taking things by mouth in the morning.  Patient states that her mood is feeling good.  She has not been showing any dangerous or aggressive behaviors.  She has been compliant with medicine.  Affect is smiling and reactive.  Patient was asked whether she was agreeable to getting maintenance ECT and the rationale was explained to her.  She stated that she was agreeable and did think it was helping.  Patient was again educated about the importance of being nothing by mouth before treatment.  We can again scheduled treatment for Friday if she is in the hospital.  May need a sitter in the morning to make sure she is nothing by mouth.  Electronic Signatures: Damond Borchers, Jackquline DenmarkJohn T (MD)  (Signed on 02-Dec-15 18:13)  Authored  Last Updated: 02-Dec-15 18:13 by Audery Amellapacs, Jody Silas T (MD)

## 2014-11-07 NOTE — Consult Note (Signed)
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  Patient had her first ECT treatment this morning.  Bilateral ECT performed under general anesthesia.  Procedure was tolerated well with no complications.  Patient recovered in the procedure room and returned to her hospital room. still not communicating verbally.  Nursing reports that she had been nodding with her family a little bit today.  Still not eating. follow-up tomorrow.  Next ECT scheduled for Wednesday.  Continue plan for ECT as primary treatment of psychotic depression  Electronic Signatures: Clapacs, Jackquline DenmarkJohn T (MD)  (Signed on 02-Nov-15 17:06)  Authored  Last Updated: 02-Nov-15 17:06 by Audery Amellapacs, John T (MD)

## 2014-11-07 NOTE — Consult Note (Signed)
PATIENT NAME:  Mary Glenn, [name] MR#:  [id number] DATE OF BIRTH:  12-07-1975  DATE OF CONSULTATION:  04/29/2014  REFERRING PHYSICIAN:   CONSULTING PHYSICIAN:  Pauletta BrownsYuriy Veronica Guerrant, MD  REASON FOR CONSULTATION:  Altered mental status.    HISTORY OF PRESENT ILLNESS:  A 39 year old Hispanic female brought to the Emergency Department with respiratory failure, found down for unknown period of time. The patient was found by family members, found to be hypoxic and obtunded, status post intubation. Case discussed with daughter who is at bedside who at baseline is taking care of her mother, states that the patient has history of schizophrenia, has not been compliant with her medications. It has been found that the patient's daughter provided a note that her mom left, it appears that this was a suicide attempt with overdose of medications. The patient's urine drug screen was negative, but the patient has a history of schizophrenia and was given multiple scripts for antipsychotics that she was not taking, but I suspect those are the medications that she was taking.   REVIEW OF SYSTEMS: Unable to obtain.   PAST MEDICAL HISTORY: Schizophrenia, type 2 diabetes, hyperlipidemia.   PAST SURGICAL HISTORY: Had some uterine operations.    MEDICATIONS: The patient does not take any medications, but questionable use of Haldol.   ALLERGIES: No known drug allergies.   LABORATORY DATA: Workup has been reviewed. CT of the head reviewed, no acute intracranial pathology.   NEUROLOGIC: The patient does not follow any commands. Pupils are 1.5-2 mm and very sluggish to react. Positive corneals. The patient was intubated, does not follow any commands. Does not respond to any painful stimuli.   IMPRESSION: A 39 year old female, likely suicide attempt with overdose antipsychotic medications, questionable seizure activity. The patient is status post EEG, no acute seizure-like activity found on EEG.   PLAN: I have ordered a  CAT scan of the head with and without contrast for tomorrow morning. Discussed with the pharmacy. The patient is going to be started on dantrolene for 24 hours for possibility of neuroleptic malignant syndrome, even though I think that is less likely, even though the patient has rigidity, but the patient has no fevers. I will obtain CK levels on this patient as well. Would appreciate psychiatric evaluation with questionable catatonia, even though I think this is more likely suicide attempt with overdose. Will follow. Case discussed with the primary team. Please call with any questions.      ____________________________ Pauletta BrownsYuriy Amilia Vandenbrink, MD yz:bu D: 04/29/2014 13:27:25 ET T: 04/29/2014 13:46:15 ET JOB#: [id number]  cc: Pauletta BrownsYuriy Qamar Aughenbaugh, MD, <Dictator> Pauletta BrownsYURIY Kadarius Cuffe MD ELECTRONICALLY SIGNED 05/20/2014 12:33

## 2014-11-07 NOTE — Consult Note (Signed)
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 Interviewed with a [facility]-provided Spanish interpreter. PAtient appeared to be more alert than previously. Made clear eyecontact and maintained it and also had recognizable facial expressions including smiling briefly. She still would not speak to me but nursing report she spoke when family were present. Still not taking any po. denied pain but admitted to feeling sick. Her answer to questions about her wish to die or be dead remain positive but wil a more ambivilent shrug. No sign of any side effects of medication/ done about medication and ECT. Plan to continue ECT with next treatment tomorrow. Continue to hold off benzos. Continue per tube remeron and risperdal using completely dissolving m-tab or Sol Tab form.  Electronic Signatures: Audery Amellapacs, Haziel Molner T (MD)  (Signed on 05-Nov-15 22:12)  Authored  Last Updated: 05-Nov-15 22:12 by Audery Amellapacs, Dashiel Bergquist T (MD)

## 2014-11-07 NOTE — Consult Note (Signed)
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   Patient was seen by consultant.   Consult note dictated.   Recommend further assessment or treatment.   Orders entered.   Comments: Consolidate to levo flagy - stopped linezolid start fluconazole Check RPR, Hep serology and leptospirosis serology.  Electronic Signatures: Dierdre HarnessFitzgerald, Allean Montfort Patrick (MD)  (Signed 20-Oct-15 14:58)  Authored: Brief Consult Note   Last Updated: 20-Oct-15 14:58 by Dierdre HarnessFitzgerald, Suzette Flagler Patrick (MD)

## 2014-11-07 NOTE — Consult Note (Signed)
Since patient had no oral intake on own, will plan EGD with PEG placement tomorrow for long term nutritional support. Continue Abx. Will need to hold off lovenox tomorrow. Make sure to continue various antibiotics so that G tube won't get infected in the near future. Thanks.   Electronic Signatures: Lutricia Feilh, English Craighead (MD) (Signed on 29-Oct-15 08:51)  Authored   Last Updated: 29-Oct-15 08:54 by Lutricia Feilh, Leeona Mccardle (MD)

## 2014-11-07 NOTE — Consult Note (Signed)
   Comments   I met with pt's daughter and elder son. Updated them on pt's current condition including pt's refusal to eat or have dobhoff placed. I discussed PEG tube with daughter and son and they are in agreement with this. GI consulted.  told family that Dr Weber Cooks had a tx plan for pt that he would need to discuss with them. Family will be here all afternoon and will meet with Dr Weber Cooks. Dr Weber Cooks notified. working on getting pt's mother to [place] from [place]. Will meet with family to update.   Electronic Signatures: Levi Crass, Izora Gala (MD)  (Signed 28-Oct-15 12:07)  Authored: Palliative Care   Last Updated: 28-Oct-15 12:07 by Syrina Wake, Izora Gala (MD)

## 2014-11-07 NOTE — Consult Note (Signed)
PATIENT NAME:  Mary Glenn, Trust MR#:  [id number] DATE OF BIRTH:  06/29/1976  DATE OF CONSULTATION:  05/13/2014  REFERRING PHYSICIAN:  Dory LarsenKurian D. Kasa, MD  CONSULTING PHYSICIAN:  Ranae PlumberKimberly A. Arvilla MarketMills, ANP (Adult Nurse Practitioner)  REASON FOR CONSULTATION: PEG tube placement.   HISTORY OF PRESENT ILLNESS: This 39 year old patient was seen for initial consult on 04/29/2014 with VDRF following apparent suicide attempt. She has been extubated on 05/09/2014 and remains off the ventilator. She has had a complicated hospital course in the ICU with cardiac arrest x 3 requiring CPR and pharmacological intervention. She has had fevers and has received antibiotic therapy. She has been followed by infectious disease specialist and her central line was discontinued 05/12/2014.   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 There is recommendation for ECT. The patient is only taking a few sips of liquid. An attempt was made to place a Dobbhoff yesterday and the patient refused this. There is now request for GE tube placement. Psychiatry feels that patient is unable to have the capacity to make her own decisions. She has an 40 year old daughter who is fluent in [place] and has been active in her care since she has been hospitalized. The clinical social worker is trying to send for her mother in [place].   Her HIV was negative. Clostridium difficile, Giardia negative. She had vaginal discharge which grew multiple organisms. On admission, showed enteritis on CT study that resolved on followup CT. She has had multiple antibiotics and her most recent urine and blood culture show no growth. RPR and hepatitis panel was negative. A QuantiFERON Gold TB test is pending. She is on Zosyn, doxycycline, fluconazole and vancomycin was restarted because she became febrile. The patient is on Lovenox injections.   REVIEW OF SYSTEMS: The patient does not respond to any  questions. She does track with her eyes. No movement of extremities on command or spontaneous noted.   PHYSICAL EXAMINATION:  VITAL SIGNS: Temperature 100.1 rectal, pulse 113, respiratory rate 19, blood pressure 133/104, saturation 98% room air. GENERAL: Middle-aged Caucasian female sitting up in semi-Fowler position. Eyes are open. She is looking around. Affect is flat. She does track with her eyes. No response to commands. HEENT: Conjunctivitis noted bilateral. Head is normocephalic. Trachea is midline.  NECK: No JVD noted.  CARDIAC: S1, S2. Tachycardia.  PULMONARY: Lungs are CTA on anterior. The patient does not take a deep breath with command. She does cough up phlegm, spits it out, clear.  ABDOMEN: Soft. Bowel sounds are present in all quadrants. No facial expression change with palpation. The patient does have a vertical scar consistent with gynecological history.  SKIN: Warm and dry. No anterior rashes noted.  EXTREMITIES: Without edema, cyanosis or clubbing.  NEUROLOGIC: As noted, tracks with eyes, does not nod her head, does not squeeze hands or follow any commands.  PSYCHIATRIC: She closes her eyes, opens them spontaneously.   LABORATORY DATA: Glucose 174, BUN is 18, creatinine 1.03, sodium 150, potassium 3.0, albumin 2.7 on 05/11/2014. WBC was 8.0, hemoglobin 11.6 on 05/12/2014.  Chest x-ray, portable, single view on 05/11/2014 showed no acute pulmonary disease, there is positive minimal subsegmental atelectasis at the lung bases.  IMPRESSION: A 39 year old patient with history of schizophrenia, diabetes mellitus, hypertension was admitted on 04/29/2014 following apparent suicide attempt. She was found unresponsive in her home but breathing on her own. Apparently she had taken multiple antipsychotic medications and left a suicide note for her family.  The patient was intubated and critically ill. She was extubated on 05/09/2014. Her course has been complicated by cardiac arrest x 3  requiring cardiopulmonary resuscitation and pharmacological intervention, likely secondary to antipsychotics. She has also had persistent fevers, multiple antibiotic therapies, cultures, with infectious diseases consultant.   She is now awake, essentially unresponsive and thought by psychiatry to have psychotic depression with catatonia. There is consideration for ECT. GI has been asked to see patient for nutrition assistance. A Dobbhoff was attempted and the patient refused. The patient is only taking a few sips of liquid. The next of kin local is her 6 year old daughter and 53 year old son. The daughter is fluent in [place] and has been visiting her mother regularly and is thought to be the person that would give consent. A clinical social worker has sent a letter to immigration to try to get the mother to come here from [place]. The patient has been shown to nod her head and move her arms.   PLAN: Concern about placing a PEG tube would include legal consent as well as the patient's risk for pulling out her tube. This case was discussed with Dr. Bluford Kaufmann who will make final recommendation after reviewing the case.   Thank you for this consultation.  These services provided by Cala Bradford A. Arvilla Market, MS, APRN, BC, ANP under collaborative agreement with Dr. Bluford Kaufmann.   ____________________________ Ranae Plumber. Arvilla Market, ANP (Adult Nurse Practitioner) kam:TT D: 05/13/2014 14:08:05 ET T: 05/13/2014 14:37:37 ET JOB#: [id number]  cc: Cala Bradford A. Arvilla Market, ANP (Adult Nurse Practitioner), <Dictator> Ranae Plumber Suzette Battiest, MSN, ANP-BC Adult Nurse Practitioner ELECTRONICALLY SIGNED 05/15/2014 17:16

## 2014-11-07 NOTE — Consult Note (Signed)
Psychiatry: PAtient interviewed with help of Spanish interpreter. She had third bilateral ECT trteatment this morning which was tolerated without any difficulty. This evening patient actually spoke aloud for the first time in my hearing on this admission. Spoke quietly  and in limited amout but lucidly in [place]. Denies any headache or pain. Remembers visit from family. Affect blunted. Did not press the interview too hard but just enjoyed and praised her improvement. Still not taking po. Hope she can start taking some water or ice. major depression psychotic with catatonia and ro bipolar or schizoaffective. Improving with ECT. Tolerating current meds which may also be contributing to improvement. Plan to continue ECT after the weekend. Plan exlained to patient.  Electronic Signatures: Audery Amellapacs, January Bergthold T (MD)  (Signed on 06-Nov-15 22:54)  Authored  Last Updated: 06-Nov-15 22:54 by Audery Amellapacs, Sani Loiseau T (MD)

## 2014-11-07 NOTE — Consult Note (Signed)
Psychiatry: PAtient had her second bilateral ECT treatment this morning. Procedure went fine with no complications. Patient still not speaking as of my last interaction and not really communicating except very ocasionally with family. Not eating but not resisting tube feeding. Can not obtain ROS. PAtient awake and seems to respond a bit with eyemovement and some expression change.  course of ECT highly variable. Typically we would expect at least 6 treatments spread over 2 weeks for improvement and it could be more than that. If, during treastment course, patient begins eating and responding more and level of nursing acuity drops, [facility] At this time, however, we must still rely on assistance of medical service and nursing for which I am very grateful because I still have strong hopes for significant improvement from this condition.remains bipolar, depressed, with psychosis vs schizoaffective disorder. dc standing ativan as her seizure was significantly shorter today. Benzos do have a place esp in catatonia but for now prefer to maximize ECT benifit. Will add Risperdal M-tab by tube for psychosis and remeron M-tab by tube for depression.  Electronic Signatures: Audery Amellapacs, Camila Maita T (MD)  (Signed on 04-Nov-15 21:50)  Authored  Last Updated: 04-Nov-15 21:50 by Audery Amellapacs, Estefany Goebel T (MD)

## 2014-11-24 NOTE — H&P (Signed)
PATIENT NAME:  Mary Glenn, Mary Glenn [id number] OF BIRTH:  07-May-1976  The patient is a 39 year old married female from [place] who was IVC' d by her daughter Mary Glenn. "I had a headache". OF PRESENT ILLNESS:  Patient displayed thought blocking during the interview and therefore little information was obtained from the assessment.  Patient reported coming to the ER because of headaches. Per consult note from ER the patient was IVC' by her daughter after patient received a call from [place] informing that her father had passed away. The patient went to the church and was acting "weird". Daughter reported that patient has a history of schizophrenia and was admitted to a hospital in [place] for approximately couple of months, and was started on medications. She took the medications for a few months only. Her daughter was concerned as patient did not talk to anybody after her father passed away. She stated that the patient was mumbling to herself. She was shaking, and was not eating anything. The patient was refusing to talk, and was refusing to eat.   Patient denied knowing if her father had passed away or not, she only knew he was sick and therefore hospitalized in [place].  ABUSE HISTORY:  Patient denied using any drugs, or alcohol at this time.   PSYCHIATRIC HISTORY: The patient has a past history of a psychiatric hospitalization in [place] where she was diagnosed with schizophrenia. However, she reported that she does not take any medication, and does not want to take anything at this time.  MEDICAL HISTORY: denies HISTORY: unknown  HISTORY:  Patient currently lives with her family members.  She was brought in by her daughter. She is currently married.   No known drug allergies.    MENTAL STATUS EXAMINATION: MENTAL STATUS EXAMINATION:mildly obsessed Hispanic female.  Dishevel, wearing hospital gownguarded, anxious CONTACT:  limited   eye contact decreased tone, volume, rate and productionACTIVITY:  decreasedPROCESS: prominent thought blockingCONTENT: unable to assess OF SYSTEMS:no weight loss, fever, chills, weakness or fatigue.no visual loos, blurred vision, double vision or yellow sclerae.  Ears, nose and throat: no hearing loos, sneezing, congestion, runny nose or sore throat.no rash or itchingno chest pain, chest pressure or discomfort.  No palpitations or edema.no shortness of breath, cough or sputum.no anorexia, nausea, vomiting or diarrhea. No abdominal pain or blood.no burning on urination.no muscle, back or joint pain/stiffness.no anemia, bleeding or bruising.no enlarged nodes. No h/o splenectomy.see history of present illness.c/o headache yesterday but denies headache today, no  dizziness, syncope, paralysis, ataxia, numbness or tingling in extremities.  No change in bowel or bladder control.no reports of sweating, cold or heat intolerance.  No polyuria or poly- dipsia.no history of asthma, hives, eczema or rhinitis.  SIGNS: BP: 132 / 90,  R: 20, P: 132, T: 98.9 PHYSICAL EXAMINATION: APPEARANCE: The patient is alert, oriented.  Patient is in no acute distress. Head is normocephalic.  Pupils are equal and reactive. Supple without lymphadenopathy. Regular rate and rhythm. normal breath sounds. No crackles or wheezes are heard. Soft, nontender, nondistended with good bowel sounds heard. Without cyanosis, clubbing or edema.  NEUROLOGICAL: Gross nonfocal. RESULTSLEVEL:negTOXICOLOGY: negPREGNANCY: negclear  IMPRESSION: I:  Schizophrenia, chronic, undifferentiated type.  II: None.  III: None reported.  IV: father?s death V: GAF 20 PLAN: 1-Will start Haldol 4 mg po bid for psychosisadd benztropine 1 mg po bid to prevent Eps2 mg po prn will be used for agitation and insomniawill be checked in ampanel and Hba1c will be checked in am?s will be rechecked in am  since they were elevated   Electronic Signatures: Jimmy FootmanHernandez-Gonzalez, Mary Glenn (MD)  (Signed on 19-Aug-15 11:31)  Authored  Last  Updated: 19-Aug-15 11:31 by Jimmy FootmanHernandez-Gonzalez, Maxamilian Amadon (MD)

## 2014-12-15 IMAGING — CT CT ABD-PELV W/ CM
2 of 4 series · 17 of 46 positions shown, 19 images · IV contrast (isovue)
Comparison: 04/28/2014

CLINICAL DATA: Fever, increased stool

EXAM:
CT ABDOMEN AND PELVIS WITH CONTRAST
TECHNIQUE: Multidetector CT imaging of the abdomen and pelvis was performed
using the standard protocol following bolus administration of
intravenous contrast. Sagittal and coronal MPR images reconstructed
from axial data set.
CONTRAST:  Dilute oral contrast.  100 cc Isovue 300 IV.

[Series 2: routine abd pel with · axial · 0.79mm/px · z∈[-945,-465]mm · 14 of 104 slices shown, 16 images]
[im 4/104  soft-tissue]
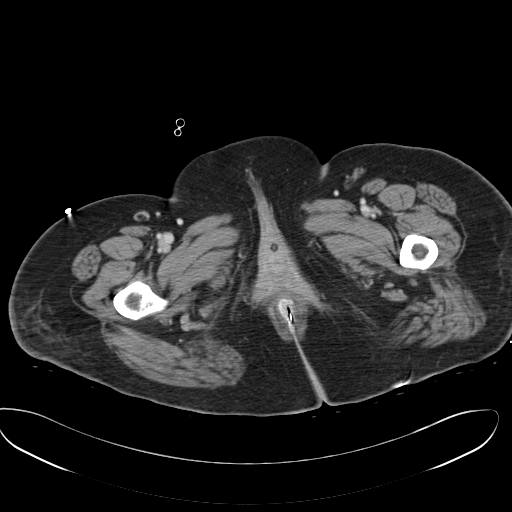
[im 4/104  bone]
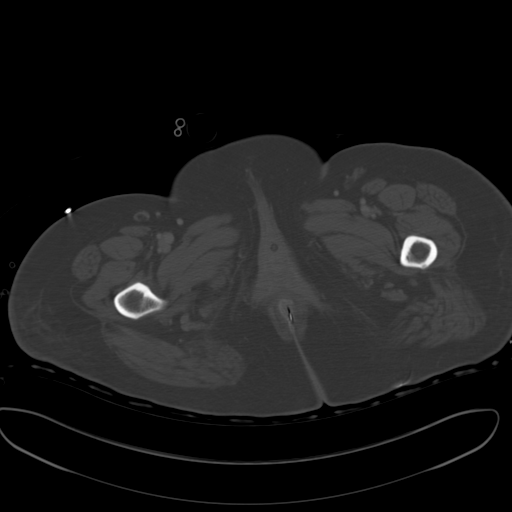
[im 12/104  soft-tissue]
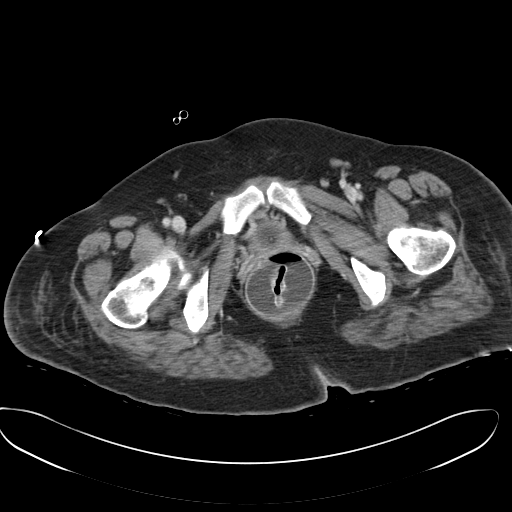
[im 20/104  soft-tissue]
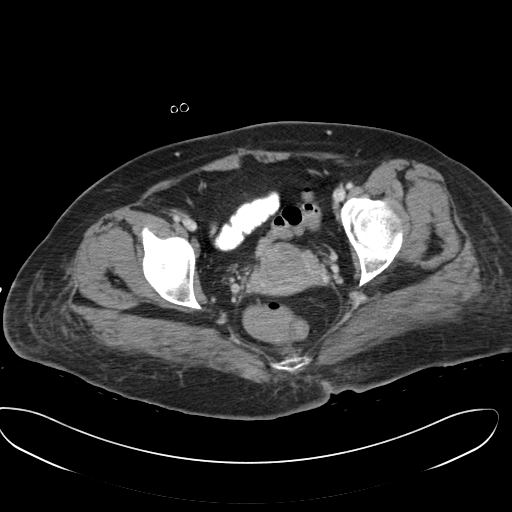
[im 28/104  soft-tissue]
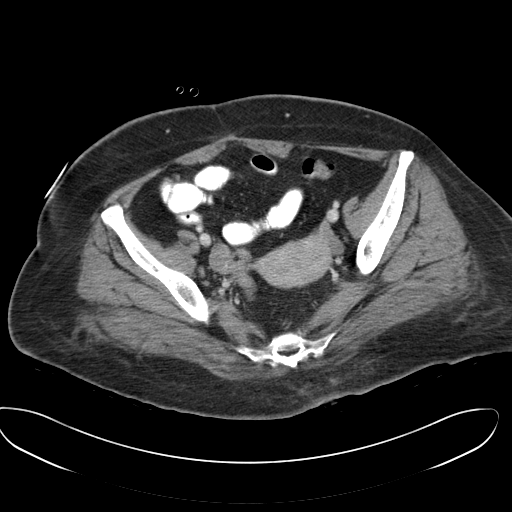
[im 36/104  soft-tissue]
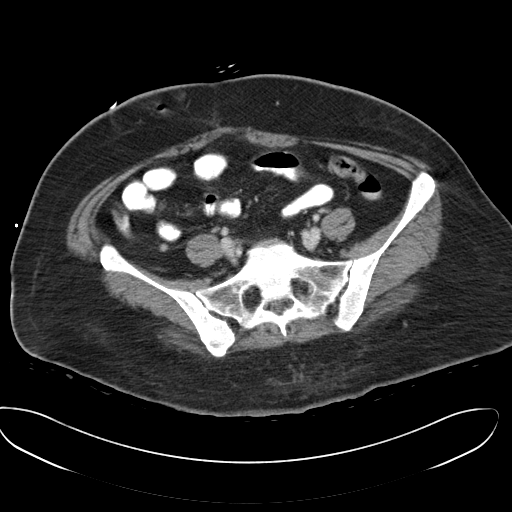
[im 40/104  soft-tissue]
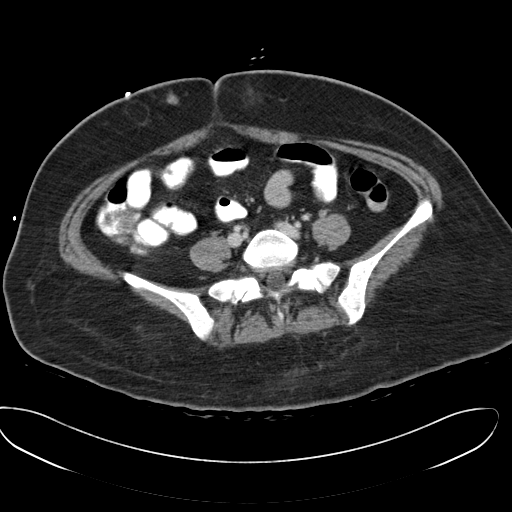
[im 48/104  soft-tissue]
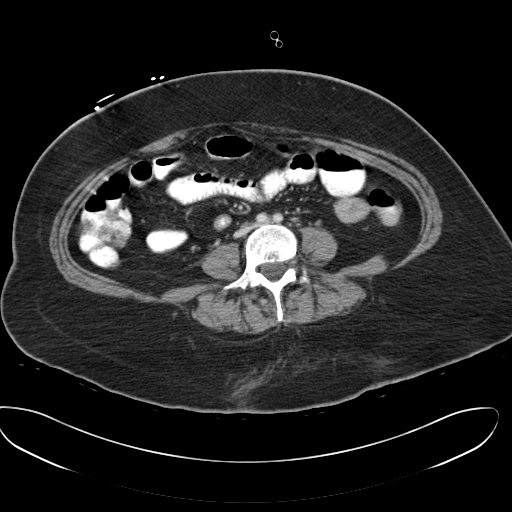
[im 56/104  soft-tissue]
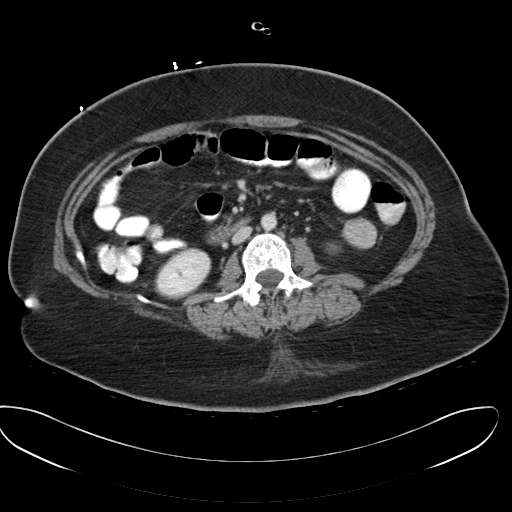
[im 64/104  soft-tissue]
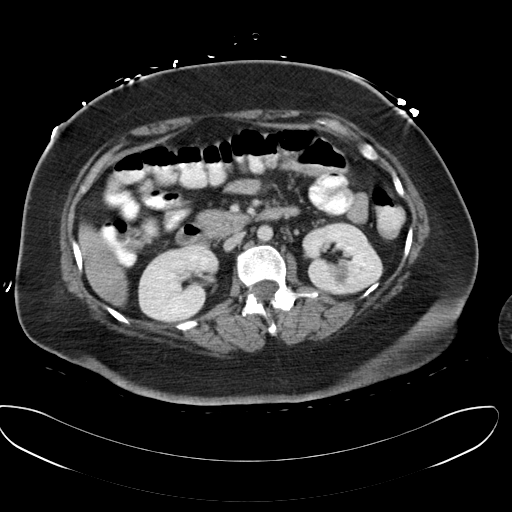
[im 64/104  bone]
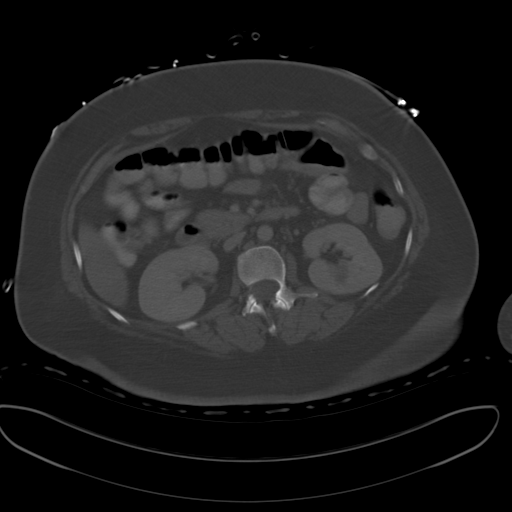
[im 68/104  soft-tissue]
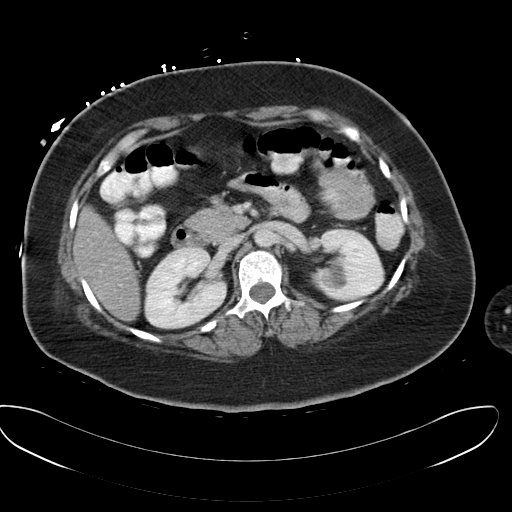
[im 76/104  soft-tissue]
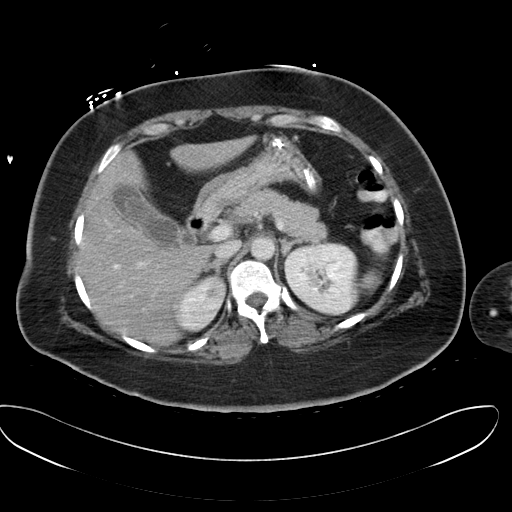
[im 84/104  soft-tissue]
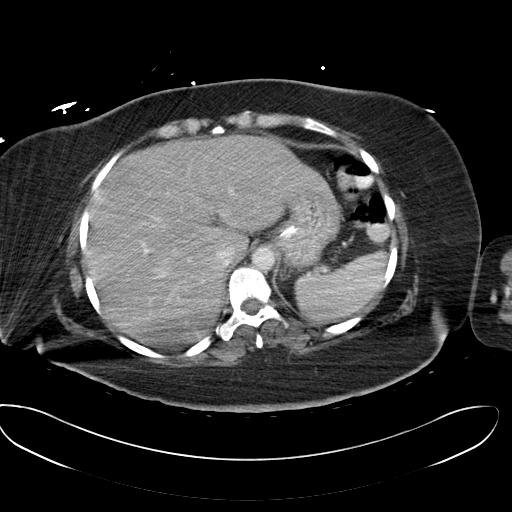
[im 92/104  soft-tissue]
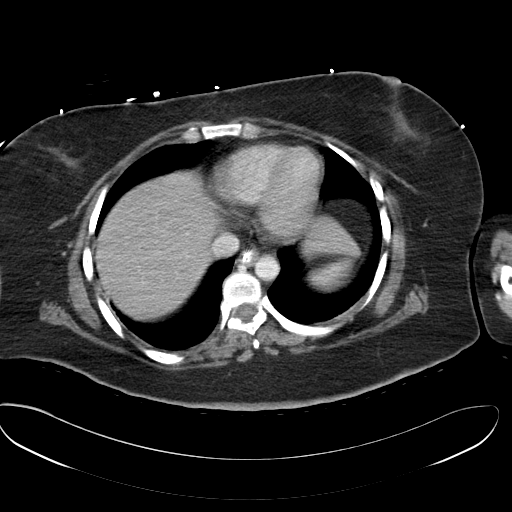
[im 100/104  soft-tissue]
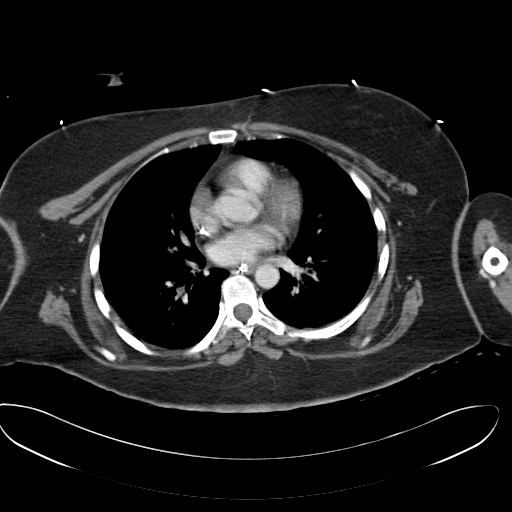

[Series 5: cor routine abd pel with · coronal · 0.87mm/px · 3 of 131 slices shown]
[im 44/131  soft-tissue]
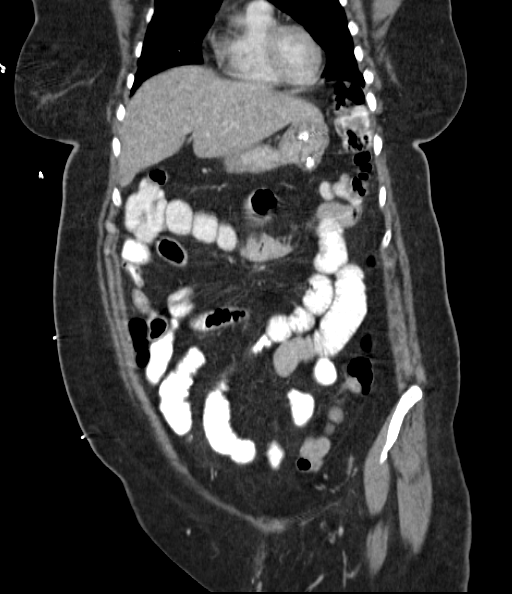
[im 58/131  soft-tissue]
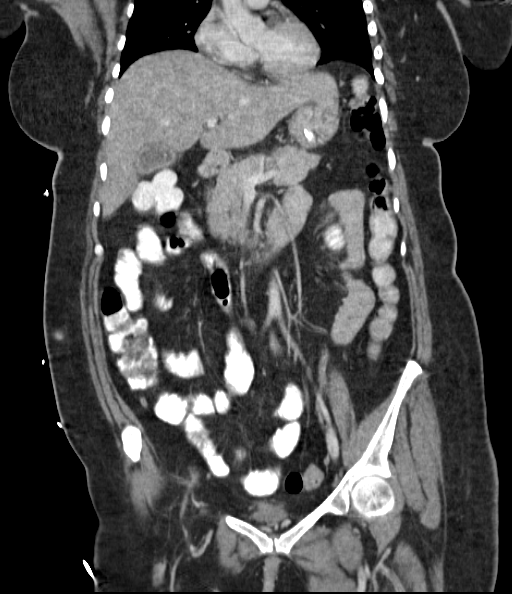
[im 73/131  soft-tissue]
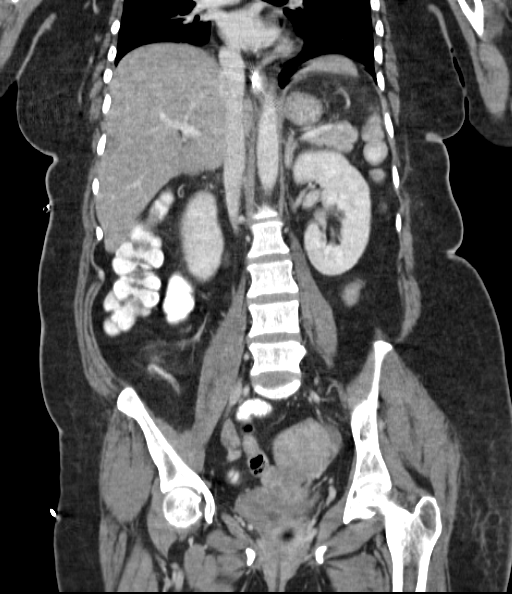

[17 of 46 positions shown; findings below may reference images not displayed]

FINDINGS: Lung bases clear.

Nasogastric tube in stomach.

Cyst medially at mid inferior LEFT kidney 2.3 x 2.0 cm image 38.

Decreased fatty infiltration of liver.

Liver, spleen, pancreas, kidneys, and adrenal glands otherwise
normal appearance.

Cholelithiasis.

Normal appendix, uterus, adnexae, and ureters.

Bladder decompressed by Foley catheter.

Foci of subcutaneous infiltration in anterior abdominal wall likely
related to medication injection.

Rectal tube present.

Stomach and bowel loops otherwise normal appearance.

No mass, adenopathy, free fluid or inflammatory process.

No acute osseous findings.
IMPRESSION: Cholelithiasis.

Small LEFT renal cyst.

No definite acute intra-abdominal or intrapelvic abnormalities.

Specifically, diffuse enteritis changes present on the prior CT exam
are no longer identified.
